# Patient Record
Sex: Female | Born: 1965 | Race: Black or African American | Hispanic: No | Marital: Single | Smoking: Never smoker
Health system: Southern US, Community
[De-identification: ages and names within clinical notes are randomized; demographics above are authoritative.]

## PROBLEM LIST (undated history)

## (undated) ENCOUNTER — Ambulatory Visit: Admission: EM | Source: Home / Self Care

## (undated) DIAGNOSIS — I10 Essential (primary) hypertension: Secondary | ICD-10-CM

## (undated) DIAGNOSIS — K219 Gastro-esophageal reflux disease without esophagitis: Secondary | ICD-10-CM

## (undated) HISTORY — DX: Essential (primary) hypertension: I10

## (undated) HISTORY — PX: OTHER SURGICAL HISTORY: SHX169

## (undated) HISTORY — PX: UMBILICAL HERNIA REPAIR: SUR1181

## (undated) HISTORY — PX: FRACTURE SURGERY: SHX138

## (undated) HISTORY — PX: KNEE SURGERY: SHX244

---

## 2004-06-26 HISTORY — PX: CHOLECYSTECTOMY: SHX55

## 2007-08-05 ENCOUNTER — Other Ambulatory Visit: Admission: RE | Admit: 2007-08-05 | Discharge: 2007-08-05 | Payer: Self-pay | Admitting: Family Medicine

## 2008-08-10 ENCOUNTER — Other Ambulatory Visit: Admission: RE | Admit: 2008-08-10 | Discharge: 2008-08-10 | Payer: Self-pay | Admitting: Family Medicine

## 2011-07-26 ENCOUNTER — Other Ambulatory Visit (HOSPITAL_COMMUNITY): Payer: Self-pay | Admitting: Physician Assistant

## 2011-07-26 DIAGNOSIS — Z1231 Encounter for screening mammogram for malignant neoplasm of breast: Secondary | ICD-10-CM

## 2011-08-22 ENCOUNTER — Ambulatory Visit (HOSPITAL_COMMUNITY)
Admission: RE | Admit: 2011-08-22 | Discharge: 2011-08-22 | Disposition: A | Discharge status: Home / Self Care | Payer: Self-pay | Source: Ambulatory Visit | Attending: Physician Assistant | Admitting: Physician Assistant

## 2011-08-22 DIAGNOSIS — Z1231 Encounter for screening mammogram for malignant neoplasm of breast: Secondary | ICD-10-CM

## 2012-08-06 ENCOUNTER — Other Ambulatory Visit (HOSPITAL_COMMUNITY): Payer: Self-pay | Admitting: Physician Assistant

## 2012-08-06 DIAGNOSIS — Z1231 Encounter for screening mammogram for malignant neoplasm of breast: Secondary | ICD-10-CM

## 2012-08-23 ENCOUNTER — Ambulatory Visit (HOSPITAL_COMMUNITY): Payer: Self-pay

## 2012-08-23 ENCOUNTER — Ambulatory Visit (HOSPITAL_COMMUNITY)
Admission: RE | Admit: 2012-08-23 | Discharge: 2012-08-23 | Disposition: A | Discharge status: Home / Self Care | Payer: Self-pay | Source: Ambulatory Visit | Attending: Physician Assistant | Admitting: Physician Assistant

## 2012-08-23 DIAGNOSIS — Z1231 Encounter for screening mammogram for malignant neoplasm of breast: Secondary | ICD-10-CM

## 2013-08-11 ENCOUNTER — Other Ambulatory Visit: Payer: Self-pay | Admitting: Nurse Practitioner

## 2013-08-11 DIAGNOSIS — N6459 Other signs and symptoms in breast: Secondary | ICD-10-CM

## 2013-08-11 DIAGNOSIS — N6310 Unspecified lump in the right breast, unspecified quadrant: Secondary | ICD-10-CM

## 2013-08-11 DIAGNOSIS — N6320 Unspecified lump in the left breast, unspecified quadrant: Secondary | ICD-10-CM

## 2013-08-13 ENCOUNTER — Other Ambulatory Visit: Payer: Self-pay

## 2013-08-22 ENCOUNTER — Ambulatory Visit
Admission: RE | Admit: 2013-08-22 | Discharge: 2013-08-22 | Disposition: A | Discharge status: Home / Self Care | Payer: BC Managed Care – PPO | Source: Ambulatory Visit | Attending: Nurse Practitioner | Admitting: Nurse Practitioner

## 2013-08-22 DIAGNOSIS — N6459 Other signs and symptoms in breast: Secondary | ICD-10-CM

## 2013-08-22 DIAGNOSIS — N6310 Unspecified lump in the right breast, unspecified quadrant: Secondary | ICD-10-CM

## 2013-08-22 DIAGNOSIS — N6320 Unspecified lump in the left breast, unspecified quadrant: Secondary | ICD-10-CM

## 2014-08-06 ENCOUNTER — Other Ambulatory Visit (HOSPITAL_COMMUNITY): Payer: Self-pay | Admitting: Internal Medicine

## 2014-08-06 DIAGNOSIS — Z1231 Encounter for screening mammogram for malignant neoplasm of breast: Secondary | ICD-10-CM

## 2014-09-01 ENCOUNTER — Ambulatory Visit (HOSPITAL_COMMUNITY)
Admission: RE | Admit: 2014-09-01 | Discharge: 2014-09-01 | Disposition: A | Discharge status: Home / Self Care | Payer: 59 | Source: Ambulatory Visit | Attending: Internal Medicine | Admitting: Internal Medicine

## 2014-09-01 ENCOUNTER — Other Ambulatory Visit (HOSPITAL_COMMUNITY): Payer: Self-pay | Admitting: Internal Medicine

## 2014-09-01 DIAGNOSIS — Z1231 Encounter for screening mammogram for malignant neoplasm of breast: Secondary | ICD-10-CM | POA: Insufficient documentation

## 2015-08-03 ENCOUNTER — Other Ambulatory Visit: Payer: Self-pay

## 2015-08-03 DIAGNOSIS — Z1231 Encounter for screening mammogram for malignant neoplasm of breast: Secondary | ICD-10-CM

## 2015-09-02 ENCOUNTER — Ambulatory Visit
Admission: RE | Admit: 2015-09-02 | Discharge: 2015-09-02 | Disposition: A | Discharge status: Home / Self Care | Payer: BLUE CROSS/BLUE SHIELD | Source: Ambulatory Visit

## 2015-09-02 DIAGNOSIS — Z1231 Encounter for screening mammogram for malignant neoplasm of breast: Secondary | ICD-10-CM

## 2016-03-02 ENCOUNTER — Encounter (HOSPITAL_COMMUNITY): Payer: Self-pay | Admitting: *Deleted

## 2016-03-02 ENCOUNTER — Emergency Department (HOSPITAL_COMMUNITY): Payer: Self-pay

## 2016-03-02 ENCOUNTER — Emergency Department (HOSPITAL_COMMUNITY)
Admission: EM | Admit: 2016-03-02 | Discharge: 2016-03-02 | Disposition: A | Discharge status: Home / Self Care | Payer: Self-pay | Attending: Emergency Medicine | Admitting: Emergency Medicine

## 2016-03-02 DIAGNOSIS — R1032 Left lower quadrant pain: Secondary | ICD-10-CM | POA: Insufficient documentation

## 2016-03-02 DIAGNOSIS — N760 Acute vaginitis: Secondary | ICD-10-CM | POA: Insufficient documentation

## 2016-03-02 DIAGNOSIS — B9689 Other specified bacterial agents as the cause of diseases classified elsewhere: Secondary | ICD-10-CM

## 2016-03-02 LAB — URINALYSIS, ROUTINE W REFLEX MICROSCOPIC
Bilirubin Urine: NEGATIVE
GLUCOSE, UA: NEGATIVE mg/dL
Ketones, ur: 15 mg/dL — AB
LEUKOCYTES UA: NEGATIVE
Nitrite: NEGATIVE
PH: 6.5 (ref 5.0–8.0)
Protein, ur: NEGATIVE mg/dL
Specific Gravity, Urine: >1.046 — ABNORMAL HIGH (ref 1.005–1.030)

## 2016-03-02 LAB — COMPREHENSIVE METABOLIC PANEL
ALT: 18 U/L (ref 14–54)
ANION GAP: 9 (ref 5–15)
AST: 21 U/L (ref 15–41)
Albumin: 3.9 g/dL (ref 3.5–5.0)
Alkaline Phosphatase: 50 U/L (ref 38–126)
BILIRUBIN TOTAL: 1.1 mg/dL (ref 0.3–1.2)
BUN: 12 mg/dL (ref 6–20)
CHLORIDE: 106 mmol/L (ref 101–111)
CO2: 22 mmol/L (ref 22–32)
Calcium: 9.3 mg/dL (ref 8.9–10.3)
Creatinine, Ser: 0.69 mg/dL (ref 0.44–1.00)
Glucose, Bld: 85 mg/dL (ref 65–99)
Potassium: 4.2 mmol/L (ref 3.5–5.1)
Sodium: 137 mmol/L (ref 135–145)
TOTAL PROTEIN: 8.3 g/dL — AB (ref 6.5–8.1)

## 2016-03-02 LAB — WET PREP, GENITAL
TRICH WET PREP: NONE SEEN
Yeast Wet Prep HPF POC: NONE SEEN

## 2016-03-02 LAB — CBC
HEMATOCRIT: 38.4 % (ref 36.0–46.0)
HEMOGLOBIN: 12.9 g/dL (ref 12.0–15.0)
MCH: 34.1 pg — ABNORMAL HIGH (ref 26.0–34.0)
MCHC: 33.6 g/dL (ref 30.0–36.0)
MCV: 101.6 fL — AB (ref 78.0–100.0)
Platelets: 369 10*3/uL (ref 150–400)
RBC: 3.78 MIL/uL — AB (ref 3.87–5.11)
RDW: 12 % (ref 11.5–15.5)
WBC: 6.3 10*3/uL (ref 4.0–10.5)

## 2016-03-02 LAB — URINE MICROSCOPIC-ADD ON

## 2016-03-02 LAB — PREGNANCY, URINE: PREG TEST UR: NEGATIVE

## 2016-03-02 LAB — LIPASE, BLOOD: LIPASE: 37 U/L (ref 11–51)

## 2016-03-02 MED ORDER — METRONIDAZOLE 500 MG PO TABS
500.0000 mg | ORAL_TABLET | Freq: Two times a day (BID) | ORAL | 0 refills | Status: DC
Start: 2016-03-02 — End: 2016-06-04

## 2016-03-02 MED ORDER — IOPAMIDOL (ISOVUE-300) INJECTION 61%
100.0000 mL | Freq: Once | INTRAVENOUS | Status: AC | PRN
  Administered 2016-03-02: 100 mL via INTRAVENOUS

## 2016-03-02 NOTE — ED Provider Notes (Signed)
[facility] DEPT Provider Note   CSN: [id number] Arrival date & time: 03/02/16  1023     History   Chief Complaint Chief Complaint  Patient presents with  . Abdominal Pain    HPI Emma Harris is a 50 y.o. female.  Emma Harris is a 50 y.o. female with no pertinent medical history presents to ED with complaint of intermittent LLQ abdominal pain. Pain started approximately 1.5 weeks ago with radiation into left flank. No discernable pattern of pain onset. When she experiences pain it is 4/10 and described as a sharp sensation. She reports she has been more constipated; last bowel movement yesterday and described as firm. No blood in stool. No fever, nausea, vomiting, dysuria, hematuria, pelvic pain, vaginal pain, or vaginal discharge. She does report vaginal spotting x 2 last week. LMP 02/17/16. She is sexually active with one female partner, significant other, no protection used. No treatments tried.       History reviewed. No pertinent past medical history.  There are no active problems to display for this patient.   Past Surgical History:  Procedure Laterality Date  . CHOLECYSTECTOMY  2006  . FRACTURE SURGERY Left    Knee    OB History    No data available       Home Medications    Prior to Admission medications   Medication Sig Start Date End Date Taking? Authorizing Provider  TRINESSA LO 0.18/0.215/0.25 MG-25 MCG tab Take 1 tablet by mouth daily. 01/01/16  Yes Historical Provider, MD  metroNIDAZOLE (FLAGYL) 500 MG tablet Take 1 tablet (500 mg total) by mouth 2 (two) times daily. 03/02/16   Roxanna Mew, PA-C    Family History No family history on file.  Social History Social History  Substance Use Topics  . Smoking status: Never Smoker  . Smokeless tobacco: Never Used  . Alcohol use No     Comment: rare     Allergies   Codeine   Review of Systems Review of Systems  Constitutional: Negative for chills, diaphoresis and fever.  HENT:  Negative for trouble swallowing.   Eyes: Negative for visual disturbance.  Respiratory: Negative for shortness of breath.   Cardiovascular: Negative for chest pain.  Gastrointestinal: Positive for abdominal pain and constipation. Negative for blood in stool, diarrhea, nausea and vomiting.  Genitourinary: Positive for vaginal bleeding ( intermittent, since resolved). Negative for dysuria, hematuria, pelvic pain, vaginal discharge and vaginal pain.  Musculoskeletal: Negative for neck pain.  Skin: Negative for rash.  Neurological: Negative for weakness, numbness and headaches.     Physical Exam Updated Vital Signs BP 145/77 (BP Location: Left Arm)   Pulse 78   Temp 98.1 F (36.7 C) (Oral)   Resp 18   Ht 5\' 4"  (1.626 m)   Wt 69.5 kg   LMP 02/17/2016   SpO2 98%   BMI 26.30 kg/m   Physical Exam  Constitutional: She appears well-developed and well-nourished. No distress.  HENT:  Head: Normocephalic and atraumatic.  Mouth/Throat: Oropharynx is clear and moist. No oropharyngeal exudate.  Eyes: Conjunctivae and EOM are normal. Pupils are equal, round, and reactive to light. Right eye exhibits no discharge. Left eye exhibits no discharge. No scleral icterus.  Neck: Normal range of motion and phonation normal. Neck supple. No neck rigidity. Normal range of motion present.  Cardiovascular: Normal rate, regular rhythm, normal heart sounds and intact distal pulses.   No murmur heard. Pulmonary/Chest: Effort normal and breath sounds normal. No stridor. No respiratory distress. She  has no wheezes. She has no rales.  Abdominal: Soft. Bowel sounds are normal. She exhibits no distension. There is tenderness in the left lower quadrant. There is no rigidity, no rebound, no guarding and no CVA tenderness.  TTP in LLQ. No peritoneal signs.   Musculoskeletal: Normal range of motion.  Lymphadenopathy:    She has no cervical adenopathy.  Neurological: She is alert. She is not disoriented. Coordination  and gait normal. GCS eye subscore is 4. GCS verbal subscore is 5. GCS motor subscore is 6.  Skin: Skin is warm and dry. She is not diaphoretic.  Psychiatric: She has a normal mood and affect. Her behavior is normal.     ED Treatments / Results  Labs (all labs ordered are listed, but only abnormal results are displayed) Labs Reviewed  WET PREP, GENITAL - Abnormal; Notable for the following:       Result Value   Clue Cells Wet Prep HPF POC PRESENT (*)    WBC, Wet Prep HPF POC FEW (*)    All other components within normal limits  COMPREHENSIVE METABOLIC PANEL - Abnormal; Notable for the following:    Total Protein 8.3 (*)    All other components within normal limits  CBC - Abnormal; Notable for the following:    RBC 3.78 (*)    MCV 101.6 (*)    MCH 34.1 (*)    All other components within normal limits  URINALYSIS, ROUTINE W REFLEX MICROSCOPIC (NOT AT [facility]) - Abnormal; Notable for the following:    Specific Gravity, Urine >1.046 (*)    Hgb urine dipstick TRACE (*)    Ketones, ur 15 (*)    All other components within normal limits  URINE MICROSCOPIC-ADD ON - Abnormal; Notable for the following:    Squamous Epithelial / LPF 0-5 (*)    Bacteria, UA RARE (*)    All other components within normal limits  LIPASE, BLOOD  PREGNANCY, URINE  GC/CHLAMYDIA PROBE AMP () NOT AT [facility]    EKG  EKG Interpretation None       Radiology Ct Abdomen Pelvis W Contrast  Result Date: 03/02/2016 CLINICAL DATA:  Ten day history of left lower quadrant pain. More recent right lower quadrant pain. EXAM: CT ABDOMEN AND PELVIS WITH CONTRAST TECHNIQUE: Multidetector CT imaging of the abdomen and pelvis was performed using the standard protocol following bolus administration of intravenous contrast. CONTRAST:  133mL ISOVUE-300 IOPAMIDOL (ISOVUE-300) INJECTION 61% COMPARISON:  None. FINDINGS: Lower chest:  Lung bases are clear.  There is a small hiatal hernia. Hepatobiliary: No focal liver lesions  are apparent. Gallbladder is absent. A small amount of air is noted in the extrahepatic biliary ductal system. There is no biliary duct dilatation. Pancreas: There is no pancreatic mass or inflammatory focus. Spleen: No splenic lesions are evident. Adrenals/Urinary Tract: Adrenals appear normal. Kidneys bilaterally show no evidence of mass or hydronephrosis on either side. There is no renal or ureteral calculus on either side. Urinary bladder is midline with wall thickness within normal limits. Stomach/Bowel: There is no appreciable bowel wall or mesenteric thickening. There is moderate stool throughout the colon. There is no bowel obstruction. No free air or portal venous air. Vascular/Lymphatic: There is no abdominal aortic aneurysm. The major mesenteric vessels appear normal. No focal vascular lesion is evident on this study. There is no adenopathy in the abdomen or pelvis. Reproductive: Uterus is anteverted. The uterus is diffusely enlarged with multiple mass lesions which appear to arise from the uterus,  felt to represent multiple eccentric leiomyomas. Ovaries cannot be distinguished reliably from the enlarged, apparently leiomyomatous uterus. It is difficult to exclude an ovarian lesion given the size and eccentric appearance of these appearing uterine lesions. There is no free pelvic fluid. Musculoskeletal: There is marked thoracolumbar dextroscoliosis. There are no blastic or lytic bone lesions. There is no intramuscular or abdominal wall lesion. Other: Appendix is not appreciable. There is no periappendiceal region inflammation. There is no abscess or ascites in the abdomen or pelvis. IMPRESSION: Enlarged apparently leiomyomatous uterus. Ovaries cannot be distinguished from the uterus. If there is concern for potential ovarian pathology, correlation with pelvic ultrasound potentially could be helpful to further assess. There is no bowel wall thickening or mesenteric thickening. No evidence of diverticulitis  in particular. No bowel obstruction. No abscess. No periappendiceal region inflammation. Gallbladder absent. A small amount of air in the extrahepatic biliary duct system suggests probable prior sphincterotomy. There is a small hiatal hernia. No renal or ureteral calculi.  No hydronephrosis. Scoliosis. Electronically Signed   By: Lowella Grip III M.D.   On: 03/02/2016 14:05    Procedures Procedures (including critical care time)  Medications Ordered in ED Medications  iopamidol (ISOVUE-300) 61 % injection 100 mL (100 mLs Intravenous Contrast Given 03/02/16 1346)     Initial Impression / Assessment and Plan / ED Course  I have reviewed the triage vital signs and the nursing notes.  Pertinent labs & imaging results that were available during my care of the patient were reviewed by me and considered in my medical decision making (see chart for details).  Clinical Course  Value Comment By Time  Appearance: CLEAR (Reviewed) Roxanna Mew, PA-C 09/07 1723  CT Abdomen Pelvis W Contrast Reviewed Roxanna Mew, PA-C 09/07 1723    Patient presents to ED complaint of intermittent abdominal pain. Patient is afebrile and non-toxic appearing in NAD. Vital signs remarkable for elevated BP, otherwise stable. Physical exam remarkable for TTP of LLQ with mild guarding. No peritoneal signs. Concern for possible ?diverticulitis vs. ?UTI/pyelonephritis. Will check labs, pelvic exam for possible pelvic infection, and CT abd/pelv given age and physical exam findings. Patient declines anything for pain at this time.   CBC re-assuring. Lipase normal - low suspicion for pancreatitis. Patient is s/p cholecystectomy. LFTs nml. Urine pregnancy negative - doubt ectopic pregnancy. U/A negative for UTI. Wet prep positive for bacterial vaginosis. CT abdomen/pelvis show no acute abdominal pathology. Moderate stool noted in colon. Of incidental finding is leiomyomatous uterus, recommend [place] follow up.    Discussed results and plan with patient. Symptomatic management of constipation. Rx for bacterial vaginosis. Encouraged follow up with OBGYN regarding pelvic US for further evaluation of leiomyomatous uterus. Return precautions discussed. Patient voiced understanding and is agreeable.    Final Clinical Impressions(s) / ED Diagnoses   Final diagnoses:  Left lower quadrant pain  Bacterial vaginosis    New Prescriptions New Prescriptions   METRONIDAZOLE (FLAGYL) 500 MG TABLET    Take 1 tablet (500 mg total) by mouth 2 (two) times daily.     Roxanna Mew, PA-C 03/02/16 [place], MD 03/02/16 [phone]

## 2016-03-02 NOTE — ED Triage Notes (Addendum)
Pt reports she started LLQ pain x 1.5 week ago and recently started to have pain in her RLQ as well.  Denies any n/v/d.  Pt reports pain is intermittent.  Pt reports noticing scant blood on her panty liner and when wiping after urinating.

## 2016-03-02 NOTE — Discharge Instructions (Signed)
Read the information below.   Your labs are re-assuring.  You have bacterial vaginosis and are being prescribed antibiotics. Take as directed.  Your CT scan shows stool in colon to suggest some constipation. Also of note, you have fibroids within your uterus. It is recommended that you have further evaluation with an [place] of your pelvis. Please call your OBGYN ASAP to schedule a follow up appointment.  Try taking OTC miralax for relief of constipation. Eat high fiber foods. Drink plenty of water. Get exercise.  You may return to the Emergency Department at any time for worsening condition or any new symptoms that concern you. Return to ED if you develop constant localized abdominal pain, fever, blood in stool.

## 2016-03-03 LAB — GC/CHLAMYDIA PROBE AMP (~~LOC~~) NOT AT ARMC
Chlamydia: NEGATIVE
Neisseria Gonorrhea: NEGATIVE

## 2016-04-03 ENCOUNTER — Emergency Department (HOSPITAL_COMMUNITY)
Admission: EM | Admit: 2016-04-03 | Discharge: 2016-04-03 | Disposition: A | Discharge status: Home / Self Care | Payer: Self-pay | Attending: Emergency Medicine | Admitting: Emergency Medicine

## 2016-04-03 ENCOUNTER — Emergency Department (HOSPITAL_COMMUNITY): Payer: Self-pay

## 2016-04-03 ENCOUNTER — Encounter (HOSPITAL_COMMUNITY): Payer: Self-pay

## 2016-04-03 DIAGNOSIS — R079 Chest pain, unspecified: Secondary | ICD-10-CM

## 2016-04-03 DIAGNOSIS — R0789 Other chest pain: Secondary | ICD-10-CM | POA: Insufficient documentation

## 2016-04-03 MED ORDER — METHOCARBAMOL 500 MG PO TABS: 500.0000 mg | ORAL_TABLET | Freq: Four times a day (QID) | ORAL | 0 refills | Status: DC

## 2016-04-03 MED ORDER — IBUPROFEN 400 MG PO TABS: 400.0000 mg | ORAL_TABLET | Freq: Four times a day (QID) | ORAL | 0 refills | Status: DC | PRN

## 2016-04-03 MED ORDER — IBUPROFEN 800 MG PO TABS
800.0000 mg | ORAL_TABLET | Freq: Once | ORAL | Status: AC
  Administered 2016-04-03: 800 mg via ORAL
  Filled 2016-04-03: qty 1

## 2016-04-03 NOTE — ED Provider Notes (Signed)
[facility] DEPT Provider Note   CSN: [id number] Arrival date & time: 04/03/16  [id number]     History   Chief Complaint Chief Complaint  Patient presents with  . Flank Pain    HPI Emma Harris is a 50 y.o. female.  50 year old female presents with one-week history of intermittent left costal margin sharp pain that is worse with position. Denies any associated dyspnea, cough, fever, chills. No anginal symptoms. Denies any rashes to the skin. No prior history of same. No medications taken for this. Denies any injury. When patient was questioned as to why she came in this morning, she states she does want to find out what was wrong with her. States her symptoms have not become worse. Called EMS and was transported here.      History reviewed. No pertinent past medical history.  There are no active problems to display for this patient.   Past Surgical History:  Procedure Laterality Date  . CHOLECYSTECTOMY  2006  . FRACTURE SURGERY Left    Knee    OB History    No data available       Home Medications    Prior to Admission medications   Medication Sig Start Date End Date Taking? Authorizing Provider  metroNIDAZOLE (FLAGYL) 500 MG tablet Take 1 tablet (500 mg total) by mouth 2 (two) times daily. 03/02/16   Roxanna Mew, PA-C  TRINESSA LO 0.18/0.215/0.25 MG-25 MCG tab Take 1 tablet by mouth daily. 01/01/16   Historical Provider, MD    Family History History reviewed. No pertinent family history.  Social History Social History  Substance Use Topics  . Smoking status: Never Smoker  . Smokeless tobacco: Never Used  . Alcohol use No     Comment: rare     Allergies   Codeine   Review of Systems Review of Systems  All other systems reviewed and are negative.    Physical Exam Updated Vital Signs BP 171/98   Pulse 92   Temp 98.4 F (36.9 C)   Resp 16   Ht 5\' 6"  (1.676 m)   Wt 69.4 kg   SpO2 98%   BMI 24.69 kg/m   Physical Exam    Constitutional: She is oriented to person, place, and time. She appears well-developed and well-nourished.  Non-toxic appearance. No distress.  HENT:  Head: Normocephalic and atraumatic.  Eyes: Conjunctivae, EOM and lids are normal. Pupils are equal, round, and reactive to light.  Neck: Normal range of motion. Neck supple. No tracheal deviation present. No thyroid mass present.  Cardiovascular: Normal rate, regular rhythm and normal heart sounds.  Exam reveals no gallop.   No murmur heard. Pulmonary/Chest: Effort normal and breath sounds normal. No stridor. No respiratory distress. She has no decreased breath sounds. She has no wheezes. She has no rhonchi. She has no rales.    Abdominal: Soft. Normal appearance and bowel sounds are normal. She exhibits no distension. There is no tenderness. There is no rebound and no CVA tenderness.  Musculoskeletal: Normal range of motion. She exhibits no edema or tenderness.  Neurological: She is alert and oriented to person, place, and time. She has normal strength. No cranial nerve deficit or sensory deficit. GCS eye subscore is 4. GCS verbal subscore is 5. GCS motor subscore is 6.  Skin: Skin is warm and dry. No abrasion and no rash noted.  Psychiatric: She has a normal mood and affect. Her speech is normal and behavior is normal.  Nursing note and vitals reviewed.  ED Treatments / Results  Labs (all labs ordered are listed, but only abnormal results are displayed) Labs Reviewed - No data to display  EKG  EKG Interpretation None       Radiology No results found.  Procedures Procedures (including critical care time)  Medications Ordered in ED Medications  ibuprofen (ADVIL,MOTRIN) tablet 800 mg (not administered)     Initial Impression / Assessment and Plan / ED Course  I have reviewed the triage vital signs and the nursing notes.  Pertinent labs & imaging results that were available during my care of the patient were reviewed by  me and considered in my medical decision making (see chart for details).  Clinical Course    Motrin given patient feels better. Chest x-ray is negative. Suspect chest wall pain will prescribe muscle relaxants.  Final Clinical Impressions(s) / ED Diagnoses   Final diagnoses:  Chest pain    New Prescriptions New Prescriptions   No medications on file     Lacretia Leigh, MD 04/03/16 [phone]

## 2016-04-03 NOTE — ED Notes (Signed)
Bed: [id number] Expected date:  Expected time:  Means of arrival:  Comments: 50yo F left flank pain

## 2016-04-03 NOTE — ED Triage Notes (Signed)
Left flank pain for one week off and on no N/V/D no fever no problems urinating.

## 2016-06-04 ENCOUNTER — Emergency Department (HOSPITAL_COMMUNITY)
Admission: EM | Admit: 2016-06-04 | Discharge: 2016-06-04 | Disposition: A | Discharge status: Home / Self Care | Payer: BLUE CROSS/BLUE SHIELD | Attending: Emergency Medicine | Admitting: Emergency Medicine

## 2016-06-04 DIAGNOSIS — M62830 Muscle spasm of back: Secondary | ICD-10-CM | POA: Insufficient documentation

## 2016-06-04 DIAGNOSIS — Z79899 Other long term (current) drug therapy: Secondary | ICD-10-CM | POA: Insufficient documentation

## 2016-06-04 LAB — POC URINE PREG, ED: PREG TEST UR: NEGATIVE

## 2016-06-04 MED ORDER — NAPROXEN 500 MG PO TABS
500.0000 mg | ORAL_TABLET | Freq: Once | ORAL | Status: AC
  Administered 2016-06-04: 500 mg via ORAL
  Filled 2016-06-04: qty 1

## 2016-06-04 MED ORDER — CYCLOBENZAPRINE HCL 5 MG PO TABS: 5.0000 mg | ORAL_TABLET | Freq: Three times a day (TID) | ORAL | 0 refills | Status: DC | PRN

## 2016-06-04 MED ORDER — NAPROXEN 500 MG PO TABS: 500.0000 mg | ORAL_TABLET | Freq: Two times a day (BID) | ORAL | 0 refills | Status: DC

## 2016-06-04 MED ORDER — OXYCODONE-ACETAMINOPHEN 5-325 MG PO TABS
1.0000 | ORAL_TABLET | Freq: Once | ORAL | Status: AC
  Administered 2016-06-04: 1 via ORAL
  Filled 2016-06-04: qty 1

## 2016-06-04 NOTE — Discharge Instructions (Signed)
Return to the ED with any concerns including difficulty breathing, weakness of arms or legs, fever/chills, or any other alarming symptoms

## 2016-06-04 NOTE — ED Triage Notes (Addendum)
Pt reports upper back pain that began at 0400 this am. Pain with movement including standing up and down. No known injury or exertional activity. Had this pain before, but went away spontaneously. No CP or SOB.

## 2016-06-04 NOTE — ED Provider Notes (Signed)
[facility] DEPT Provider Note   CSN: [id number] Arrival date & time: 06/04/16  1837     History   Chief Complaint Chief Complaint  Patient presents with  . Back Pain    HPI Emma Harris is a 50 y.o. female.  HPI  Pt presenting with c/o upper back pain. Pain is in right upper back. She states she has had similar pain in the past and it has been intermittent over the past year.  She took robaxin today which did not help very much.  Pain began last night and became worse gradually throughout the day today.  No injury.  No dififculty breathing.  No fever/chills, no cough. No new activities.  There are no other associated systemic symptoms, there are no other alleviating or modifying factors.   No neck pain or weakness of arms or legs.    No past medical history on file.  There are no active problems to display for this patient.   Past Surgical History:  Procedure Laterality Date  . CHOLECYSTECTOMY  2006  . FRACTURE SURGERY Left    Knee    OB History    No data available       Home Medications    Prior to Admission medications   Medication Sig Start Date End Date Taking? Authorizing Provider  ibuprofen (ADVIL,MOTRIN) 800 MG tablet Take 800 mg by mouth every 8 (eight) hours as needed for headache, mild pain or moderate pain.   Yes Historical Provider, MD  methocarbamol (ROBAXIN) 500 MG tablet Take 500 mg by mouth 4 (four) times daily as needed for muscle spasms.   Yes Historical Provider, MD  cyclobenzaprine (FLEXERIL) 5 MG tablet Take 1 tablet (5 mg total) by mouth 3 (three) times daily as needed for muscle spasms. 06/04/16   Alfonzo Beers, MD  naproxen (NAPROSYN) 500 MG tablet Take 1 tablet (500 mg total) by mouth 2 (two) times daily. 06/04/16   Alfonzo Beers, MD    Family History No family history on file.  Social History Social History  Substance Use Topics  . Smoking status: Never Smoker  . Smokeless tobacco: Never Used  . Alcohol use No     Comment:  rare     Allergies   Shellfish allergy and Codeine   Review of Systems Review of Systems  ROS reviewed and all otherwise negative except for mentioned in HPI   Physical Exam Updated Vital Signs BP 143/99 (BP Location: Right Arm)   Pulse 86   Temp 98.2 F (36.8 C) (Oral)   Resp 16   SpO2 97%  Vitals reviewed Physical Exam Physical Examination: General appearance - alert, well appearing, and in no distress Mental status - alert, oriented to person, place, and time Eyes - no conjunctival injection, no scleral icterus Neck - no midline or paraspinal tenderness to palpation Chest - clear to auscultation, no wheezes, rales or rhonchi, symmetric air entry Heart - normal rate, regular rhythm, normal S1, S2, no murmurs, rubs, clicks or gallops Abdomen - soft, nontender, nondistended, no masses or organomegaly Back exam -  No midline tenderness to palpation of c/t/l spine, ttp over right rhomboid distribution Neurological - alert, oriented, normal speech, strength 5/5 in extremities x 4, sensation intact Musculoskeletal - no joint tenderness, deformity or swelling Extremities - peripheral pulses normal, no pedal edema, no clubbing or cyanosis Skin - normal coloration and turgor, no rashes  ED Treatments / Results  Labs (all labs ordered are listed, but only abnormal results are displayed)  Labs Reviewed  POC URINE PREG, ED    EKG  EKG Interpretation None       Radiology No results found.  Procedures Procedures (including critical care time)  Medications Ordered in ED Medications  oxyCODONE-acetaminophen (PERCOCET/ROXICET) 5-325 MG per tablet 1 tablet (1 tablet Oral Given 06/04/16 2029)  naproxen (NAPROSYN) tablet 500 mg (500 mg Oral Given 06/04/16 2030)     Initial Impression / Assessment and Plan / ED Course  I have reviewed the triage vital signs and the nursing notes.  Pertinent labs & imaging results that were available during my care of the patient were  reviewed by me and considered in my medical decision making (see chart for details).  Clinical Course     Pt presenting with c/o right upper back pain- pain is located over right rhomboid distribution. No bony point tenderness.  Pt treated with pain meds and muscle relaxer.  Discharged with strict return precautions.  Pt agreeable with plan.  Final Clinical Impressions(s) / ED Diagnoses   Final diagnoses:  Muscle spasm of back    New Prescriptions Discharge Medication List as of 06/04/2016  9:19 PM    START taking these medications   Details  cyclobenzaprine (FLEXERIL) 5 MG tablet Take 1 tablet (5 mg total) by mouth 3 (three) times daily as needed for muscle spasms., Starting Sun 06/04/2016, Print    naproxen (NAPROSYN) 500 MG tablet Take 1 tablet (500 mg total) by mouth 2 (two) times daily., Starting Sun 06/04/2016, Print         Alfonzo Beers, MD 06/07/16 [phone]

## 2016-09-06 ENCOUNTER — Emergency Department (HOSPITAL_COMMUNITY)
Admission: EM | Admit: 2016-09-06 | Discharge: 2016-09-06 | Disposition: A | Discharge status: Home / Self Care | Payer: BLUE CROSS/BLUE SHIELD | Attending: Emergency Medicine | Admitting: Emergency Medicine

## 2016-09-06 ENCOUNTER — Emergency Department (HOSPITAL_COMMUNITY): Payer: BLUE CROSS/BLUE SHIELD

## 2016-09-06 ENCOUNTER — Encounter (HOSPITAL_COMMUNITY): Payer: Self-pay

## 2016-09-06 DIAGNOSIS — X500XXA Overexertion from strenuous movement or load, initial encounter: Secondary | ICD-10-CM | POA: Insufficient documentation

## 2016-09-06 DIAGNOSIS — R0789 Other chest pain: Secondary | ICD-10-CM | POA: Insufficient documentation

## 2016-09-06 DIAGNOSIS — Z79899 Other long term (current) drug therapy: Secondary | ICD-10-CM | POA: Insufficient documentation

## 2016-09-06 DIAGNOSIS — Y929 Unspecified place or not applicable: Secondary | ICD-10-CM | POA: Insufficient documentation

## 2016-09-06 DIAGNOSIS — Y990 Civilian activity done for income or pay: Secondary | ICD-10-CM | POA: Insufficient documentation

## 2016-09-06 DIAGNOSIS — Y939 Activity, unspecified: Secondary | ICD-10-CM | POA: Insufficient documentation

## 2016-09-06 MED ORDER — CYCLOBENZAPRINE HCL 10 MG PO TABS: 10.0000 mg | ORAL_TABLET | Freq: Three times a day (TID) | ORAL | 0 refills | Status: DC | PRN

## 2016-09-06 MED ORDER — MELOXICAM 7.5 MG PO TABS: 7.5000 mg | ORAL_TABLET | Freq: Every day | ORAL | 0 refills | Status: DC | PRN

## 2016-09-06 NOTE — ED Provider Notes (Signed)
[facility] DEPT Provider Note   CSN: [id number] Arrival date & time: 09/06/16  1014     History   Chief Complaint Chief Complaint  Patient presents with  . Chest Pain  . Shoulder Pain    HPI Emma Harris is a 51 y.o. female.   HPI   Patient presents with right sided chest pain that has been ongoing for 1 week, worse with movement of her arm and lifting things.  She does heavy lifting at her job and this has exacerbated her pain.  The pain is also worse with certain positions, particularly getting comfortable at night.  The pain is not exertional or pleuritic.  She took 500mg  robaxin, and ibuprofen without improvement.  Denies fevers, cough, SOB, diaphoresis, lightheadedness/dizziness, nausea, leg swelling.  No recent immobilization, exogenous estrogen, family or personal hx blood clots, family hx early CAD.   She states that this pain feels muscular but that she thinks she needs stronger medicine for it.    History reviewed. No pertinent past medical history.  There are no active problems to display for this patient.   Past Surgical History:  Procedure Laterality Date  . CHOLECYSTECTOMY  2006  . FRACTURE SURGERY Left    Knee    OB History    No data available       Home Medications    Prior to Admission medications   Medication Sig Start Date End Date Taking? Authorizing Provider  cyclobenzaprine (FLEXERIL) 10 MG tablet Take 1 tablet (10 mg total) by mouth 3 (three) times daily as needed for muscle spasms (or pain). 09/06/16   Clayton Bibles, PA-C  meloxicam (MOBIC) 7.5 MG tablet Take 1 tablet (7.5 mg total) by mouth daily as needed for pain. 09/06/16   Clayton Bibles, PA-C    Family History History reviewed. No pertinent family history.  Social History Social History  Substance Use Topics  . Smoking status: Never Smoker  . Smokeless tobacco: Never Used  . Alcohol use No     Comment: rare     Allergies   Shellfish allergy and Codeine   Review of  Systems Review of Systems  Constitutional: Negative for chills and fever.  Respiratory: Negative for cough and shortness of breath.   Cardiovascular: Positive for chest pain.  Gastrointestinal: Negative for nausea.  Musculoskeletal: Negative for back pain and neck pain.  Skin: Negative for color change.  Allergic/Immunologic: Negative for immunocompromised state.  Neurological: Negative for dizziness, weakness, light-headedness and numbness.     Physical Exam Updated Vital Signs BP 151/91 (BP Location: Left Arm)   Pulse 95   Temp 98.1 F (36.7 C) (Oral)   Resp 18   SpO2 100%   Physical Exam  Constitutional: She appears well-developed and well-nourished. No distress.  HENT:  Head: Normocephalic and atraumatic.  Neck: Neck supple.  Cardiovascular: Normal rate and regular rhythm.   Pulmonary/Chest: Effort normal and breath sounds normal. No respiratory distress. She has no wheezes. She has no rales. She exhibits tenderness (right).  Abdominal: Soft. She exhibits no distension. There is no tenderness. There is no rebound and no guarding.  Musculoskeletal: She exhibits no edema.  Neurological: She is alert.  Skin: She is not diaphoretic.  Nursing note and vitals reviewed.    ED Treatments / Results  Labs (all labs ordered are listed, but only abnormal results are displayed) Labs Reviewed - No data to display  EKG  EKG Interpretation  Date/Time:  Wednesday September 06 2016 10:23:36 EDT Ventricular Rate:  94 PR Interval:    QRS Duration: 83 QT Interval:  359 QTC Calculation: 449 R Axis:   49 Text Interpretation:  Sinus rhythm Low voltage, precordial leads Abnormal R-wave progression, early transition Nonspecific T abnormalities, lateral leads agree. no STEMI. no acute ischemic appearance. no old comparison Confirmed by Johnney Killian, MD, Jeannie Done [phone]) on 09/06/2016 10:31:02 AM Also confirmed by Johnney Killian, MD, Jeannie Done [phone]), editor Drema Pry [phone])  on 09/06/2016  10:36:02 AM       Radiology Dg Chest 2 View  Result Date: 09/06/2016 CLINICAL DATA:  New mid chest pain for approx 1 week, nonsmoker, no other chest complaints EXAM: CHEST  2 VIEW COMPARISON:  04/03/2016 FINDINGS: The cardiac silhouette is normal in size and configuration. No mediastinal or hilar masses or evidence of adenopathy. The lungs are clear.  No pleural effusion.  No pneumothorax. There is a prominent S-shaped scoliosis of the thoracic spine, convex to the left in the upper thoracic spine and to the right in the lower thoracic spine, stable. IMPRESSION: No active cardiopulmonary disease. Electronically Signed   By: Lajean Manes M.D.   On: 09/06/2016 10:56    Procedures Procedures (including critical care time)  Medications Ordered in ED Medications - No data to display   Initial Impression / Assessment and Plan / ED Course  I have reviewed the triage vital signs and the nursing notes.  Pertinent labs & imaging results that were available during my care of the patient were reviewed by me and considered in my medical decision making (see chart for details).    Afebrile, nontoxic patient with chest wall pain and tenderness reproduced with palpation.  Has been doing heavy lifting - symptoms reproduced with similar listing.  Not exertional, not pleuritic.  Clinically doubt ACS, PE.  CXR negative.  EKG nonischemic.    D/C home with flexeril, mobic. PCP follow up.   Discussed result, findings, treatment, and follow up  with patient.  Pt given return precautions.  Pt verbalizes understanding and agrees with plan.       Final Clinical Impressions(s) / ED Diagnoses   Final diagnoses:  Chest wall pain    New Prescriptions Discharge Medication List as of 09/06/2016 11:15 AM    START taking these medications   Details  meloxicam (MOBIC) 7.5 MG tablet Take 1 tablet (7.5 mg total) by mouth daily as needed for pain., Starting Wed 09/06/2016, Print         [place], PA-C 09/06/16  [place], MD 09/06/16 2011

## 2016-09-06 NOTE — ED Triage Notes (Signed)
Pt states chest and shoulder pain x 1 week.  Worse when lying down.  Shoulder hurts with movement.  No shortness of breath.  No cough/congestion.  Chest pain increases with movement.  Pt does lift heavy objects at work.  Pain comes and goes.

## 2016-09-06 NOTE — Discharge Instructions (Signed)
Read the information below.  Use the prescribed medication as directed.  Please discuss all new medications with your pharmacist.  You may return to the Emergency Department at any time for worsening condition or any new symptoms that concern you.    If you develop worsening chest pain, shortness of breath, fever, you pass out, or become weak or dizzy, return to the ER for a recheck.     You have been diagnosed by your caregiver as having chest wall pain. SEEK IMMEDIATE MEDICAL ATTENTION IF: You develop a fever.  Your chest pains become severe or intolerable.  You develop new, unexplained symptoms (problems).  You develop shortness of breath, nausea, vomiting, sweating or feel light headed.  You develop a new cough or you cough up blood.

## 2016-12-13 ENCOUNTER — Emergency Department (HOSPITAL_COMMUNITY)
Admission: EM | Admit: 2016-12-13 | Discharge: 2016-12-13 | Disposition: A | Discharge status: Home / Self Care | Payer: BLUE CROSS/BLUE SHIELD | Attending: Emergency Medicine | Admitting: Emergency Medicine

## 2016-12-13 DIAGNOSIS — M25511 Pain in right shoulder: Secondary | ICD-10-CM | POA: Insufficient documentation

## 2016-12-13 DIAGNOSIS — R1013 Epigastric pain: Secondary | ICD-10-CM | POA: Insufficient documentation

## 2016-12-13 DIAGNOSIS — M79604 Pain in right leg: Secondary | ICD-10-CM | POA: Insufficient documentation

## 2016-12-13 DIAGNOSIS — R6889 Other general symptoms and signs: Secondary | ICD-10-CM | POA: Insufficient documentation

## 2016-12-13 NOTE — ED Triage Notes (Addendum)
Pt c/o right arm, right leg pain, with some intermittent abdominal pain- pain when moving right foot. Pt states symptoms have been occurring for a week on and off. Right arm hurts more when raising it. Denies nausea, vomiting, diarrhea, urinary symptoms. Pt went to see a chiropractor last week.

## 2016-12-13 NOTE — ED Notes (Signed)
Bed: WA11 Expected date:  Expected time:  Means of arrival:  Comments: 

## 2016-12-13 NOTE — Discharge Instructions (Signed)
Take 4 over the counter ibuprofen tablets 3 times a day or 2 over-the-counter naproxen tablets twice a day for pain. Also take tylenol 1000mg(2 extra strength) four times a day.    

## 2016-12-13 NOTE — ED Provider Notes (Signed)
[facility] DEPT Provider Note   CSN: [id number] Arrival date & time: 12/13/16  0440     History   Chief Complaint Chief Complaint  Patient presents with  . Generalized Body Aches    HPI Emma Harris is a 51 y.o. female.  51 yo F with a cc of feeling weird.  Unable to describe further.  Going on for past week.  Denies chest pain, sob, abdominal pain, n/v/d.  Mild headache similar to prior.  Denies other complaint.  Woke her up from sleep.  Nothing makes it better or worse.  Also complaining of R shoulder pain with extreme forward flexion.  Pain to anterior aspect.  Denies injury.    The history is provided by the patient.  Illness  This is a new problem. The current episode started more than 1 week ago. The problem occurs constantly. The problem has not changed since onset.Pertinent negatives include no chest pain, no headaches and no shortness of breath. Nothing aggravates the symptoms. Nothing relieves the symptoms. She has tried nothing for the symptoms. The treatment provided no relief.    No past medical history on file.  There are no active problems to display for this patient.   Past Surgical History:  Procedure Laterality Date  . CHOLECYSTECTOMY  2006  . FRACTURE SURGERY Left    Knee    OB History    No data available       Home Medications    Prior to Admission medications   Medication Sig Start Date End Date Taking? Authorizing Provider  cyclobenzaprine (FLEXERIL) 10 MG tablet Take 1 tablet (10 mg total) by mouth 3 (three) times daily as needed for muscle spasms (or pain). Patient not taking: Reported on 12/13/2016 09/06/16   Clayton Bibles, PA-C  meloxicam (MOBIC) 7.5 MG tablet Take 1 tablet (7.5 mg total) by mouth daily as needed for pain. Patient not taking: Reported on 12/13/2016 09/06/16   Clayton Bibles, PA-C    Family History No family history on file.  Social History Social History  Substance Use Topics  . Smoking status: Never Smoker  .  Smokeless tobacco: Never Used  . Alcohol use No     Comment: rare     Allergies   Shellfish allergy and Codeine   Review of Systems Review of Systems  Constitutional: Negative for chills and fever.       Feels weird  HENT: Negative for congestion and rhinorrhea.   Eyes: Negative for redness and visual disturbance.  Respiratory: Negative for shortness of breath and wheezing.   Cardiovascular: Negative for chest pain and palpitations.  Gastrointestinal: Negative for nausea and vomiting.  Genitourinary: Negative for dysuria and urgency.  Musculoskeletal: Negative for arthralgias and myalgias.  Skin: Negative for pallor and wound.  Neurological: Negative for dizziness and headaches.     Physical Exam Updated Vital Signs BP (!) 149/99 (BP Location: Left Arm)   Pulse 82   Temp 98.3 F (36.8 C) (Oral)   Resp 15   Ht 5\' 4"  (1.626 m)   Wt 73 kg (161 lb)   SpO2 100%   BMI 27.64 kg/m   Physical Exam  Constitutional: She is oriented to person, place, and time. She appears well-developed and well-nourished. No distress.  HENT:  Head: Normocephalic and atraumatic.  Eyes: EOM are normal. Pupils are equal, round, and reactive to light.  Neck: Normal range of motion. Neck supple.  Cardiovascular: Normal rate and regular rhythm.  Exam reveals no gallop and no friction  rub.   No murmur heard. Pulmonary/Chest: Effort normal. She has no wheezes. She has no rales.  Abdominal: Soft. She exhibits no distension and no mass. There is tenderness in the epigastric area. There is no guarding.  Musculoskeletal: She exhibits no edema or tenderness.       Arms: Neurological: She is alert and oriented to person, place, and time. She has normal strength. No cranial nerve deficit or sensory deficit. Coordination and gait normal. She displays no Babinski's sign on the right side. She displays no Babinski's sign on the left side.  Reflex Scores:      Tricep reflexes are 2+ on the right side and 2+ on  the left side.      Bicep reflexes are 2+ on the right side and 2+ on the left side.      Brachioradialis reflexes are 2+ on the right side and 2+ on the left side.      Patellar reflexes are 2+ on the right side and 2+ on the left side.      Achilles reflexes are 2+ on the right side and 2+ on the left side. Skin: Skin is warm and dry. She is not diaphoretic.  Psychiatric: She has a normal mood and affect. Her behavior is normal.  Nursing note and vitals reviewed.    ED Treatments / Results  Labs (all labs ordered are listed, but only abnormal results are displayed) Labs Reviewed - No data to display  EKG  EKG Interpretation None       Radiology No results found.  Procedures Procedures (including critical care time)  Medications Ordered in ED Medications - No data to display   Initial Impression / Assessment and Plan / ED Course  I have reviewed the triage vital signs and the nursing notes.  Pertinent labs & imaging results that were available during my care of the patient were reviewed by me and considered in my medical decision making (see chart for details).     51 yo F with not feeling right.  Unable to quantify further.  Well appearing, non toxic.  Vitals unremarkable.  Mild epigastric abdominal pain with patient attributes to seeing a chiropractor recently.  Also complaining of R shoulder pain, worse with extreme forward flexion.  No neuro deficits.  Offered labs though with no other symptoms difficult to identify what we would be looking for.  Patient elects for no labs at this time.  Will follow up with PCP.   8:27 AM:  I have discussed the diagnosis/risks/treatment options with the patient and family and believe the pt to be eligible for discharge home to follow-up with PCP. We also discussed returning to the ED immediately if new or worsening sx occur. We discussed the sx which are most concerning (e.g., sudden worsening pain, fever, inability to tolerate by  mouth) that necessitate immediate return. Medications administered to the patient during their visit and any new prescriptions provided to the patient are listed below.  Medications given during this visit Medications - No data to display   The patient appears reasonably screen and/or stabilized for discharge and I doubt any other medical condition or other [facility] requiring further screening, evaluation, or treatment in the ED at this time prior to discharge.    Final Clinical Impressions(s) / ED Diagnoses   Final diagnoses:  Does not feel right  Acute pain of right shoulder    New Prescriptions New Prescriptions   No medications on file     [place],  Linna Hoff, DO 12/13/16 9969

## 2016-12-30 ENCOUNTER — Emergency Department (HOSPITAL_COMMUNITY)
Admission: EM | Admit: 2016-12-30 | Discharge: 2016-12-30 | Disposition: A | Discharge status: Home / Self Care | Payer: BLUE CROSS/BLUE SHIELD | Attending: Emergency Medicine | Admitting: Emergency Medicine

## 2016-12-30 ENCOUNTER — Encounter (HOSPITAL_COMMUNITY): Payer: Self-pay | Admitting: Emergency Medicine

## 2016-12-30 DIAGNOSIS — Z5321 Procedure and treatment not carried out due to patient leaving prior to being seen by health care provider: Secondary | ICD-10-CM | POA: Insufficient documentation

## 2016-12-30 NOTE — ED Triage Notes (Signed)
Pt from home with concerns regarding her blood pressure. Pt denies headache or other pain at this time. Pt states she checked her bp at [facility] today and her systolic was around 245. Pt states she then drank caffeine and she felt as if her bp rose. Pt is not hypertensive at time of assessment (137/87)

## 2017-07-19 DIAGNOSIS — Z1211 Encounter for screening for malignant neoplasm of colon: Secondary | ICD-10-CM | POA: Diagnosis not present

## 2017-07-19 DIAGNOSIS — Z800 Family history of malignant neoplasm of digestive organs: Secondary | ICD-10-CM | POA: Diagnosis not present

## 2017-07-24 ENCOUNTER — Ambulatory Visit: Payer: Self-pay | Admitting: Podiatry

## 2017-07-24 ENCOUNTER — Encounter: Payer: Self-pay | Admitting: Podiatry

## 2017-07-24 ENCOUNTER — Ambulatory Visit (INDEPENDENT_AMBULATORY_CARE_PROVIDER_SITE_OTHER): Payer: Self-pay

## 2017-07-24 DIAGNOSIS — M778 Other enthesopathies, not elsewhere classified: Secondary | ICD-10-CM

## 2017-07-24 DIAGNOSIS — M779 Enthesopathy, unspecified: Principal | ICD-10-CM

## 2017-07-24 DIAGNOSIS — M7750 Other enthesopathy of unspecified foot: Secondary | ICD-10-CM

## 2017-07-24 MED ORDER — METHYLPREDNISOLONE 4 MG PO TBPK: ORAL_TABLET | ORAL | 0 refills | Status: DC

## 2017-07-24 MED ORDER — MELOXICAM 15 MG PO TABS: 15.0000 mg | ORAL_TABLET | Freq: Every day | ORAL | 3 refills | Status: DC

## 2017-07-24 NOTE — Progress Notes (Signed)
  Subjective:  Patient ID: Emma Harris, female    DOB: [birth year],  MRN: [id number] HPI Chief Complaint  Patient presents with  . Foot Pain    1st toe and MPJ bilateral (R>L) - sharp, radiating pain along dorsal and medial side, cramping hallux, tried elevating at night and rest, also having leg pains    52 y.o. female presents with the above complaint.     No past medical history on file. Past Surgical History:  Procedure Laterality Date  . CHOLECYSTECTOMY  2006  . FRACTURE SURGERY Left    Knee   No current outpatient medications on file.  Allergies  Allergen Reactions  . Shellfish Allergy Anaphylaxis  . Codeine Other (See Comments)    Pt states that this medication makes her "crazy".    Review of Systems  All other systems reviewed and are negative.  Objective:  There were no vitals filed for this visit.  General: Well developed, nourished, in no acute distress, alert and oriented x3   Dermatological: Skin is warm, dry and supple bilateral. Nails x 10 are well maintained; remaining integument appears unremarkable at this time. There are no open sores, no preulcerative lesions, no rash or signs of infection present.  Vascular: Dorsalis Pedis artery and Posterior Tibial artery pedal pulses are 2/4 bilateral with immedate capillary fill time. Pedal hair growth present. No varicosities and no lower extremity edema present bilateral.   Neruologic: Grossly intact via light touch bilateral. Vibratory intact via tuning fork bilateral. Protective threshold with Semmes Wienstein monofilament intact to all pedal sites bilateral. Patellar and Achilles deep tendon reflexes 2+ bilateral. No Babinski or clonus noted bilateral.   Musculoskeletal: No gross boney pedal deformities bilateral. No pain, crepitus, or limitation noted with foot and ankle range of motion bilateral. Muscular strength 5/5 in all groups tested bilateral.  Gait: Unassisted, Nonantalgic.     Radiographs:  Radiographs 3 views of bilateral foot taken today demonstrate mild pes planus with some joint space narrowing of the first metatarsophalangeal joint.  Very little in the way of osteoarthritic change in this area.  No acute osseous changes.    Assessment & Plan:   Assessment: Capsulitis of the first metatarsophalangeal joint bilateral and stenosing tenosynovitis of the FHL.  Plan: Discussed etiology pathology conservative versus surgical therapies.  I recommended an intra-articular injection or an A1 pulley injection however she declined.  I did place her on a Medrol Dosepak to be followed by meloxicam.  Discussed appropriate shoe gear with a stiff sole.  She understands this is amenable to it we will follow-up with me in the near future.     Elvis Laufer T. [place], [place]

## 2017-08-06 DIAGNOSIS — Z1231 Encounter for screening mammogram for malignant neoplasm of breast: Secondary | ICD-10-CM | POA: Diagnosis not present

## 2017-08-06 DIAGNOSIS — R350 Frequency of micturition: Secondary | ICD-10-CM | POA: Diagnosis not present

## 2017-08-06 DIAGNOSIS — Z124 Encounter for screening for malignant neoplasm of cervix: Secondary | ICD-10-CM | POA: Diagnosis not present

## 2017-08-06 DIAGNOSIS — Z01419 Encounter for gynecological examination (general) (routine) without abnormal findings: Secondary | ICD-10-CM | POA: Diagnosis not present

## 2017-08-06 DIAGNOSIS — D259 Leiomyoma of uterus, unspecified: Secondary | ICD-10-CM | POA: Diagnosis not present

## 2017-08-06 DIAGNOSIS — R319 Hematuria, unspecified: Secondary | ICD-10-CM | POA: Diagnosis not present

## 2017-08-15 DIAGNOSIS — Z1211 Encounter for screening for malignant neoplasm of colon: Secondary | ICD-10-CM | POA: Diagnosis not present

## 2017-08-15 DIAGNOSIS — Z800 Family history of malignant neoplasm of digestive organs: Secondary | ICD-10-CM | POA: Diagnosis not present

## 2017-08-15 LAB — HM COLONOSCOPY

## 2017-08-23 ENCOUNTER — Ambulatory Visit: Payer: Self-pay | Admitting: Podiatry

## 2017-09-04 ENCOUNTER — Encounter: Payer: Self-pay | Admitting: Podiatry

## 2017-09-04 ENCOUNTER — Ambulatory Visit: Payer: BLUE CROSS/BLUE SHIELD | Admitting: Podiatry

## 2017-09-04 DIAGNOSIS — M778 Other enthesopathies, not elsewhere classified: Secondary | ICD-10-CM

## 2017-09-04 DIAGNOSIS — M7750 Other enthesopathy of unspecified foot: Secondary | ICD-10-CM

## 2017-09-04 DIAGNOSIS — M779 Enthesopathy, unspecified: Principal | ICD-10-CM

## 2017-09-04 NOTE — Progress Notes (Signed)
She states that she presents today for follow-up of her capsulitis.  She states that the fever getting better that she stopped taking the medicine for a little while but is starting to hurt again.  Objective: Vital signs are stable alert and oriented x3.  Pulses are palpable.  Neurologic sensorium is intact.  Deep tendon reflexes are intact.  No significant findings today on physical exam.  Assessment: Capsulitis resolving.  Plan: Follow-up with her in 2 months if not completely improved and consider MRI or CT scan.  May need to consider orthotics.

## 2017-09-11 DIAGNOSIS — R7989 Other specified abnormal findings of blood chemistry: Secondary | ICD-10-CM | POA: Diagnosis not present

## 2017-09-11 DIAGNOSIS — R5383 Other fatigue: Secondary | ICD-10-CM | POA: Diagnosis not present

## 2017-09-11 DIAGNOSIS — R030 Elevated blood-pressure reading, without diagnosis of hypertension: Secondary | ICD-10-CM | POA: Diagnosis not present

## 2017-10-16 DIAGNOSIS — L0291 Cutaneous abscess, unspecified: Secondary | ICD-10-CM | POA: Diagnosis not present

## 2017-10-16 DIAGNOSIS — I10 Essential (primary) hypertension: Secondary | ICD-10-CM | POA: Diagnosis not present

## 2017-11-06 ENCOUNTER — Ambulatory Visit: Payer: BLUE CROSS/BLUE SHIELD | Admitting: Podiatry

## 2017-11-15 DIAGNOSIS — I10 Essential (primary) hypertension: Secondary | ICD-10-CM | POA: Diagnosis not present

## 2017-12-26 DIAGNOSIS — N952 Postmenopausal atrophic vaginitis: Secondary | ICD-10-CM | POA: Diagnosis not present

## 2017-12-26 DIAGNOSIS — R3915 Urgency of urination: Secondary | ICD-10-CM | POA: Diagnosis not present

## 2017-12-26 DIAGNOSIS — N941 Unspecified dyspareunia: Secondary | ICD-10-CM | POA: Diagnosis not present

## 2017-12-26 DIAGNOSIS — D259 Leiomyoma of uterus, unspecified: Secondary | ICD-10-CM | POA: Diagnosis not present

## 2018-02-06 DIAGNOSIS — D259 Leiomyoma of uterus, unspecified: Secondary | ICD-10-CM | POA: Diagnosis not present

## 2018-02-06 DIAGNOSIS — N952 Postmenopausal atrophic vaginitis: Secondary | ICD-10-CM | POA: Diagnosis not present

## 2018-03-07 ENCOUNTER — Other Ambulatory Visit: Payer: Self-pay | Admitting: Obstetrics and Gynecology

## 2018-03-07 DIAGNOSIS — R9389 Abnormal findings on diagnostic imaging of other specified body structures: Secondary | ICD-10-CM | POA: Diagnosis not present

## 2018-03-07 DIAGNOSIS — N941 Unspecified dyspareunia: Secondary | ICD-10-CM | POA: Diagnosis not present

## 2018-03-07 DIAGNOSIS — N951 Menopausal and female climacteric states: Secondary | ICD-10-CM | POA: Diagnosis not present

## 2018-03-07 DIAGNOSIS — R350 Frequency of micturition: Secondary | ICD-10-CM | POA: Diagnosis not present

## 2018-03-07 DIAGNOSIS — N710 Acute inflammatory disease of uterus: Secondary | ICD-10-CM | POA: Diagnosis not present

## 2018-04-10 DIAGNOSIS — R3915 Urgency of urination: Secondary | ICD-10-CM | POA: Diagnosis not present

## 2018-04-10 DIAGNOSIS — R351 Nocturia: Secondary | ICD-10-CM | POA: Diagnosis not present

## 2018-04-24 ENCOUNTER — Other Ambulatory Visit: Payer: Self-pay | Admitting: Podiatry

## 2018-05-22 DIAGNOSIS — R682 Dry mouth, unspecified: Secondary | ICD-10-CM | POA: Diagnosis not present

## 2018-05-22 DIAGNOSIS — R351 Nocturia: Secondary | ICD-10-CM | POA: Diagnosis not present

## 2018-05-22 DIAGNOSIS — R3915 Urgency of urination: Secondary | ICD-10-CM | POA: Diagnosis not present

## 2018-05-28 DIAGNOSIS — B349 Viral infection, unspecified: Secondary | ICD-10-CM | POA: Diagnosis not present

## 2018-05-28 DIAGNOSIS — Z6827 Body mass index (BMI) 27.0-27.9, adult: Secondary | ICD-10-CM | POA: Diagnosis not present

## 2018-05-28 DIAGNOSIS — R109 Unspecified abdominal pain: Secondary | ICD-10-CM | POA: Diagnosis not present

## 2018-05-28 DIAGNOSIS — R5383 Other fatigue: Secondary | ICD-10-CM | POA: Diagnosis not present

## 2018-05-28 DIAGNOSIS — N390 Urinary tract infection, site not specified: Secondary | ICD-10-CM | POA: Diagnosis not present

## 2018-05-28 DIAGNOSIS — R350 Frequency of micturition: Secondary | ICD-10-CM | POA: Diagnosis not present

## 2018-05-28 DIAGNOSIS — Z79899 Other long term (current) drug therapy: Secondary | ICD-10-CM | POA: Diagnosis not present

## 2018-05-28 DIAGNOSIS — D7589 Other specified diseases of blood and blood-forming organs: Secondary | ICD-10-CM | POA: Diagnosis not present

## 2018-05-29 ENCOUNTER — Emergency Department (HOSPITAL_COMMUNITY): Payer: BLUE CROSS/BLUE SHIELD

## 2018-05-29 ENCOUNTER — Encounter (HOSPITAL_COMMUNITY): Payer: Self-pay | Admitting: Emergency Medicine

## 2018-05-29 ENCOUNTER — Emergency Department (HOSPITAL_COMMUNITY)
Admission: EM | Admit: 2018-05-29 | Discharge: 2018-05-29 | Disposition: A | Discharge status: Home / Self Care | Payer: BLUE CROSS/BLUE SHIELD | Attending: Emergency Medicine | Admitting: Emergency Medicine

## 2018-05-29 ENCOUNTER — Other Ambulatory Visit: Payer: Self-pay

## 2018-05-29 DIAGNOSIS — B349 Viral infection, unspecified: Secondary | ICD-10-CM

## 2018-05-29 DIAGNOSIS — R109 Unspecified abdominal pain: Secondary | ICD-10-CM

## 2018-05-29 DIAGNOSIS — D7589 Other specified diseases of blood and blood-forming organs: Secondary | ICD-10-CM

## 2018-05-29 LAB — BASIC METABOLIC PANEL
ANION GAP: 7 (ref 5–15)
BUN: 13 mg/dL (ref 6–20)
CALCIUM: 9.9 mg/dL (ref 8.9–10.3)
CO2: 25 mmol/L (ref 22–32)
Chloride: 107 mmol/L (ref 98–111)
Creatinine, Ser: 0.61 mg/dL (ref 0.44–1.00)
Glucose, Bld: 104 mg/dL — ABNORMAL HIGH (ref 70–99)
POTASSIUM: 3.9 mmol/L (ref 3.5–5.1)
Sodium: 139 mmol/L (ref 135–145)

## 2018-05-29 LAB — URINALYSIS, ROUTINE W REFLEX MICROSCOPIC
BILIRUBIN URINE: NEGATIVE
Bacteria, UA: NONE SEEN
GLUCOSE, UA: NEGATIVE mg/dL
KETONES UR: NEGATIVE mg/dL
NITRITE: NEGATIVE
PH: 7 (ref 5.0–8.0)
PROTEIN: NEGATIVE mg/dL
Specific Gravity, Urine: 1.012 (ref 1.005–1.030)

## 2018-05-29 LAB — CBC
HCT: 38.7 % (ref 36.0–46.0)
HEMOGLOBIN: 12.4 g/dL (ref 12.0–15.0)
MCH: 33.5 pg (ref 26.0–34.0)
MCHC: 32 g/dL (ref 30.0–36.0)
MCV: 104.6 fL — ABNORMAL HIGH (ref 80.0–100.0)
NRBC: 0 % (ref 0.0–0.2)
PLATELETS: 350 10*3/uL (ref 150–400)
RBC: 3.7 MIL/uL — AB (ref 3.87–5.11)
RDW: 11.4 % — ABNORMAL LOW (ref 11.5–15.5)
WBC: 7 10*3/uL (ref 4.0–10.5)

## 2018-05-29 MED ORDER — IBUPROFEN 200 MG PO TABS
400.0000 mg | ORAL_TABLET | Freq: Once | ORAL | Status: AC
  Administered 2018-05-29: 400 mg via ORAL
  Filled 2018-05-29: qty 2

## 2018-05-29 NOTE — ED Provider Notes (Signed)
[facility] DEPT Provider Note   CSN: [id number] Arrival date & time: 05/28/18  2337     History   Chief Complaint Chief Complaint  Patient presents with  . Flank Pain    HPI Emma Harris is a 52 y.o. female.  The history is provided by the patient.  Yesterday, she noted that she was having trouble organizing her thoughts.  Today she developed some urinary frequency and went to urgent care where she was diagnosed with urinary tract infection and given prescription for an antibiotic.  However, she was not able to pick the prescription up before the pharmacy closed.  Tonight, she developed pain in her right flank and chills.  She denies fever or sweats.  She denies nausea or vomiting.  She rates her flank pain at 4/10.  Nothing makes it better, nothing makes it worse.  History reviewed. No pertinent past medical history.  There are no active problems to display for this patient.   Past Surgical History:  Procedure Laterality Date  . CHOLECYSTECTOMY  2006  . FRACTURE SURGERY Left    Knee     OB History   None      Home Medications    Prior to Admission medications   Medication Sig Start Date End Date Taking? Authorizing Provider  amLODipine (NORVASC) 5 MG tablet Take 5 mg by mouth daily. 05/28/18  Yes [provider]  meloxicam (MOBIC) 15 MG tablet Take 1 tablet (15 mg total) by mouth daily. 07/24/17  Yes Hyatt, Max T, DPM  oxybutynin (DITROPAN XL) 15 MG 24 hr tablet Take 15 mg by mouth daily. 04/10/18  Yes [provider]    Family History No family history on file.  Social History Social History   Tobacco Use  . Smoking status: Never Smoker  . Smokeless tobacco: Never Used  Substance Use Topics  . Alcohol use: No    Comment: rare  . Drug use: No     Allergies   Shellfish allergy and Codeine   Review of Systems Review of Systems  All other systems reviewed and are negative.    Physical  Exam Updated Vital Signs BP (!) 137/91 (BP Location: Left Arm)   Pulse 74   Temp 97.7 F (36.5 C) (Oral)   Resp 16   Ht 5\' 4"  (1.626 m)   Wt 73.5 kg   SpO2 98%   BMI 27.81 kg/m   Physical Exam  Nursing note and vitals reviewed.  52 year old female, resting comfortably and in no acute distress. Vital signs are significant for borderline elevated blood pressure. Oxygen saturation is 98%, which is normal. Head is normocephalic and atraumatic. PERRLA, EOMI. Oropharynx is clear. Neck is nontender and supple without adenopathy or JVD. Back is nontender in the midline.  There is mild right CVA tenderness. Lungs are clear without rales, wheezes, or rhonchi. Chest is nontender. Heart has regular rate and rhythm without murmur. Abdomen is soft, flat, nontender without masses or hepatosplenomegaly and peristalsis is hypoactive. Extremities have no cyanosis or edema, full range of motion is present. Skin is warm and dry without rash. Neurologic: Mental status is normal, cranial nerves are intact, there are no motor or sensory deficits.  ED Treatments / Results  Labs (all labs ordered are listed, but only abnormal results are displayed) Labs Reviewed  URINALYSIS, ROUTINE W REFLEX MICROSCOPIC - Abnormal; Notable for the following components:      Result Value   Color, Urine STRAW (*)  Hgb urine dipstick SMALL (*)    Leukocytes, UA TRACE (*)    All other components within normal limits  BASIC METABOLIC PANEL - Abnormal; Notable for the following components:   Glucose, Bld 104 (*)    All other components within normal limits  CBC - Abnormal; Notable for the following components:   RBC 3.70 (*)    MCV 104.6 (*)    RDW 11.4 (*)    All other components within normal limits  URINE CULTURE   Radiology Ct Renal Stone Study  Result Date: 05/29/2018 CLINICAL DATA:  RIGHT flank pain, chills, frequent urination. Recent diagnosis of urinary tract infection, not on medication. History of  cholecystectomy. EXAM: CT ABDOMEN AND PELVIS WITHOUT CONTRAST TECHNIQUE: Multidetector CT imaging of the abdomen and pelvis was performed following the standard protocol without IV contrast. COMPARISON:  CT abdomen and pelvis March 02, 2016 FINDINGS: LOWER CHEST: Lung bases are clear. The visualized heart size is normal. No pericardial effusion. HEPATOBILIARY: Status post cholecystectomy. Subcentimeter calcification dome of liver, otherwise unremarkable. PANCREAS: Normal. SPLEEN: Normal. ADRENALS/URINARY TRACT: Kidneys are orthotopic, demonstrating normal size and morphology. 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 No nephrolithiasis, hydronephrosis; limited assessment for renal masses by nonenhanced CT. The unopacified ureters are normal in course and caliber. Urinary bladder is partially distended and unremarkable. Normal adrenal glands. STOMACH/BOWEL: Small hiatal hernia. The stomach, small and large bowel are normal in course and caliber without inflammatory changes, sensitivity decreased by lack of enteric contrast. Small volume retained large bowel stool. Small amount of small bowel feces seen with chronic stasis. Normal appendix. VASCULAR/LYMPHATIC: Aortoiliac vessels are normal in course and caliber. No lymphadenopathy by CT size criteria. 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 OTHER: No intraperitoneal free fluid or free air. MUSCULOSKELETAL: Non-acute. Dextroscoliosis thoracic spine. Mild sacroiliac osteoarthrosis. IMPRESSION: 1. No nephrolithiasis, obstructive uropathy nor acute intra-abdominal/pelvic process. Electronically Signed   By: Elon Alas M.D.   On: 05/29/2018 03:15    Procedures Procedures   Medications Ordered in ED Medications  ibuprofen (ADVIL,MOTRIN) tablet 400 mg (400 mg Oral Given 05/29/18 0221)     Initial Impression / Assessment and Plan / ED Course  I have reviewed the triage vital signs  and the nursing notes.  Pertinent labs & imaging results that were available during my care of the patient were reviewed by me and considered in my medical decision making (see chart for details).  Right flank pain with urinary frequency.  Urinalysis does not show evidence of UTI.  Screening labs show normal electrolytes, BUN, creatinine, WBC, hemoglobin, platelets.  Incidental finding of macrocytosis without anemia.  She will be sent for renal stone protocol CT scan.  Old records are reviewed, and she has no relevant past visits.  CT scan of abdomen and pelvis in September 2017 did not show evidence of nephrolithiasis.  CT scan shows no acute process.  Patient states that she is feeling well.  This appears to be a viral illness.  She is advised to continue using over-the-counter analgesics as needed for pain, return precautions discussed.  Final Clinical Impressions(s) / ED Diagnoses   Final diagnoses:  Right flank pain  Macrocytosis without anemia  Viral illness    ED Discharge Orders    None       Delora Fuel, MD 25/36/64 [phone]

## 2018-05-29 NOTE — ED Triage Notes (Signed)
Pt arriving POV from home with complaint of right sided flank pain, chills, and frequent urination. Pt was diagnosed with UTI today at Urgent Care and was given a prescription for antibiotics. Pt reports she has not picked up her prescription.

## 2018-05-29 NOTE — Discharge Instructions (Addendum)
Your evaluation in the emergency department did not show signs of urinary infection, or any other serious problem.  I believe that you have a virus.  Please drink plenty of fluids, take over-the-counter medications such as acetaminophen, ibuprofen, naproxen as needed for pain.  Return if symptoms worsen.

## 2018-05-30 LAB — URINE CULTURE: Culture: <10000 — AB

## 2018-08-07 DIAGNOSIS — Z01411 Encounter for gynecological examination (general) (routine) with abnormal findings: Secondary | ICD-10-CM | POA: Diagnosis not present

## 2018-08-07 DIAGNOSIS — L0292 Furuncle, unspecified: Secondary | ICD-10-CM | POA: Diagnosis not present

## 2018-08-07 DIAGNOSIS — Z1231 Encounter for screening mammogram for malignant neoplasm of breast: Secondary | ICD-10-CM | POA: Diagnosis not present

## 2018-08-07 DIAGNOSIS — Z6829 Body mass index (BMI) 29.0-29.9, adult: Secondary | ICD-10-CM | POA: Diagnosis not present

## 2018-08-20 DIAGNOSIS — M9902 Segmental and somatic dysfunction of thoracic region: Secondary | ICD-10-CM | POA: Diagnosis not present

## 2018-08-20 DIAGNOSIS — M5384 Other specified dorsopathies, thoracic region: Secondary | ICD-10-CM | POA: Diagnosis not present

## 2018-08-20 DIAGNOSIS — M9903 Segmental and somatic dysfunction of lumbar region: Secondary | ICD-10-CM | POA: Diagnosis not present

## 2018-08-20 DIAGNOSIS — M545 Low back pain: Secondary | ICD-10-CM | POA: Diagnosis not present

## 2018-08-21 ENCOUNTER — Other Ambulatory Visit: Payer: Self-pay

## 2018-08-21 ENCOUNTER — Emergency Department (HOSPITAL_COMMUNITY)
Admission: EM | Admit: 2018-08-21 | Discharge: 2018-08-21 | Disposition: A | Discharge status: Home / Self Care | Payer: BLUE CROSS/BLUE SHIELD | Attending: Emergency Medicine | Admitting: Emergency Medicine

## 2018-08-21 ENCOUNTER — Encounter (HOSPITAL_COMMUNITY): Payer: Self-pay

## 2018-08-21 DIAGNOSIS — R350 Frequency of micturition: Secondary | ICD-10-CM | POA: Diagnosis not present

## 2018-08-21 DIAGNOSIS — R197 Diarrhea, unspecified: Secondary | ICD-10-CM | POA: Diagnosis not present

## 2018-08-21 DIAGNOSIS — Z79899 Other long term (current) drug therapy: Secondary | ICD-10-CM | POA: Diagnosis not present

## 2018-08-21 DIAGNOSIS — I10 Essential (primary) hypertension: Secondary | ICD-10-CM | POA: Diagnosis not present

## 2018-08-21 DIAGNOSIS — R6883 Chills (without fever): Secondary | ICD-10-CM | POA: Diagnosis not present

## 2018-08-21 LAB — URINALYSIS, ROUTINE W REFLEX MICROSCOPIC
BACTERIA UA: NONE SEEN
BILIRUBIN URINE: NEGATIVE
Glucose, UA: NEGATIVE mg/dL
Hgb urine dipstick: NEGATIVE
KETONES UR: NEGATIVE mg/dL
Nitrite: NEGATIVE
Protein, ur: NEGATIVE mg/dL
Specific Gravity, Urine: 1.008 (ref 1.005–1.030)
pH: 7 (ref 5.0–8.0)

## 2018-08-21 MED ORDER — SODIUM CHLORIDE 0.9% FLUSH: 3.0000 mL | Freq: Once | INTRAVENOUS | Status: DC

## 2018-08-21 NOTE — ED Provider Notes (Signed)
[facility] DEPT Provider Note   CSN: [id number] Arrival date & time: 08/21/18  1696    History   Chief Complaint Chief Complaint  Patient presents with  . Chills  . Diarrhea    HPI Emma Harris is a 53 y.o. female.     HPI   Pt is a 53 y/o female who presents to the ED today c/o chills and diarrhea that began last night. States she had two small episodes of diarrhea. She has had no further episodes of diarrhea. She no longer has the chills. No vomiting or abd pain. She reports some urinary frequency (chronic for almost a year, on oxybutynin for this). Denies dysuria, hematuria or urgency.   States her roommate was sick last week with similar sxs. No recent foreign travel. No recent consumption of suspect foods.   History reviewed. No pertinent past medical history.  There are no active problems to display for this patient.   Past Surgical History:  Procedure Laterality Date  . CHOLECYSTECTOMY  2006  . FRACTURE SURGERY Left    Knee     OB History   No obstetric history on file.      Home Medications    Prior to Admission medications   Medication Sig Start Date End Date Taking? Authorizing Provider  amLODipine (NORVASC) 5 MG tablet Take 5 mg by mouth daily. 05/28/18  Yes [provider]    Family History Family History  Problem Relation Age of Onset  . Hypertension Mother     Social History Social History   Tobacco Use  . Smoking status: Never Smoker  . Smokeless tobacco: Never Used  Substance Use Topics  . Alcohol use: Yes    Comment: rare  . Drug use: No     Allergies   Shellfish allergy and Codeine   Review of Systems Review of Systems  Constitutional: Positive for chills (resolved). Negative for fever.  HENT: Negative for sore throat.   Eyes: Negative for visual disturbance.  Respiratory: Negative for cough and shortness of breath.   Cardiovascular: Negative for chest pain.  Gastrointestinal:  Positive for diarrhea. Negative for abdominal pain, blood in stool, constipation, nausea and vomiting.  Genitourinary: Positive for frequency (chronic). Negative for dysuria, hematuria and urgency.  Musculoskeletal: Negative for back pain.  Skin: Negative for rash.  Neurological: Negative for headaches.  All other systems reviewed and are negative.    Physical Exam Updated Vital Signs BP (!) 141/91 (BP Location: Left Arm)   Pulse 78   Temp 98.5 F (36.9 C) (Oral)   Resp 16   Ht 5' (1.524 m)   Wt 72.6 kg   SpO2 100%   BMI 31.25 kg/m   Physical Exam Vitals signs and nursing note reviewed.  Constitutional:      General: She is not in acute distress.    Appearance: She is well-developed. She is not ill-appearing or toxic-appearing.  HENT:     Head: Normocephalic and atraumatic.  Eyes:     Conjunctiva/sclera: Conjunctivae normal.  Neck:     Musculoskeletal: Neck supple.  Cardiovascular:     Rate and Rhythm: Normal rate and regular rhythm.     Heart sounds: Normal heart sounds. No murmur.  Pulmonary:     Effort: Pulmonary effort is normal. No respiratory distress.     Breath sounds: Normal breath sounds. No stridor.  Abdominal:     General: Bowel sounds are normal. There is no distension.     Palpations: Abdomen  is soft.     Tenderness: There is no abdominal tenderness. There is no right CVA tenderness, left CVA tenderness, guarding or rebound.  Skin:    General: Skin is warm and dry.  Neurological:     Mental Status: She is alert.     ED Treatments / Results  Labs (all labs ordered are listed, but only abnormal results are displayed) Labs Reviewed  URINALYSIS, ROUTINE W REFLEX MICROSCOPIC - Abnormal; Notable for the following components:      Result Value   Color, Urine STRAW (*)    Leukocytes,Ua SMALL (*)    All other components within normal limits  URINE CULTURE    EKG None  Radiology No results found.  Procedures Procedures (including critical care  time)  Medications Ordered in ED Medications  sodium chloride flush (NS) 0.9 % injection 3 mL (has no administration in time range)     Initial Impression / Assessment and Plan / ED Course  I have reviewed the triage vital signs and the nursing notes.  Pertinent labs & imaging results that were available during my care of the patient were reviewed by me and considered in my medical decision making (see chart for details).    Final Clinical Impressions(s) / ED Diagnoses   Final diagnoses:  Diarrhea, unspecified type   Patient presenting to the ED for evaluation of 2 episodes of diarrhea and chills that started last night.  She has no abdominal pain.  No vomiting.  She reports urinary frequency but states that this is a chronic problem for her.  She is requesting that we check her urine for urinary tract infection.  She has some trace leukocytes in her urine but otherwise only has 0-5 RBCs, 0-5 white blood cells, no bacteria.  I have very low suspicion for urinary tract infection.  I do not think that labs are indicated as patient has only had 2 episodes of diarrhea and therefore less likely to have significant electrolyte derangement.  Her abdomen is soft and nontender.  She is not febrile.  Vital signs are normal.  Suspect she has a viral illness.  Advised supportive therapy and hydration.  Advised to follow-up with her PCP and return to the ER for new or worsening symptoms.  She voices understanding of the plan and reasons to return.  All questions were answered.  Patient stable for discharge.  ED Discharge Orders    None       Bishop Dublin 08/21/18 3646    Dorie Rank, MD 08/22/18 [phone]

## 2018-08-21 NOTE — ED Notes (Signed)
[facility] Provider at bedside. 

## 2018-08-21 NOTE — Discharge Instructions (Addendum)
Your urinalysis did not show significant evidence of a urinary tract infection.   A culture was sent of your urine today to determine if there is any bacterial growth. If the results of the culture are positive and you require an antibiotic or a change of your prescribed antibiotic you will be contacted by the hospital. If the results are negative you will not be contacted.  Please follow up with your primary care provider within 5-7 days for re-evaluation of your symptoms. If you do not have a primary care provider, information for a healthcare clinic has been provided for you to make arrangements for follow up care. Please return to the emergency department for any new or worsening symptoms.

## 2018-08-21 NOTE — ED Triage Notes (Addendum)
Per EMS- Patient c/o chills and diarrhea when she woke this AM.  Patient added in triage that she has had intermittent abdominal cramping x 2 weeks.

## 2018-08-21 NOTE — ED Notes (Signed)
NO RX GIVEN, WORK NOTE GIVEN,

## 2018-08-22 LAB — URINE CULTURE

## 2018-08-23 DIAGNOSIS — N390 Urinary tract infection, site not specified: Secondary | ICD-10-CM | POA: Diagnosis not present

## 2018-08-23 DIAGNOSIS — R110 Nausea: Secondary | ICD-10-CM | POA: Diagnosis not present

## 2018-08-29 DIAGNOSIS — E669 Obesity, unspecified: Secondary | ICD-10-CM | POA: Diagnosis not present

## 2018-08-29 DIAGNOSIS — Z1331 Encounter for screening for depression: Secondary | ICD-10-CM | POA: Diagnosis not present

## 2018-08-29 DIAGNOSIS — R350 Frequency of micturition: Secondary | ICD-10-CM | POA: Diagnosis not present

## 2018-08-29 DIAGNOSIS — I10 Essential (primary) hypertension: Secondary | ICD-10-CM | POA: Diagnosis not present

## 2018-08-29 DIAGNOSIS — R7989 Other specified abnormal findings of blood chemistry: Secondary | ICD-10-CM | POA: Diagnosis not present

## 2018-09-05 DIAGNOSIS — R1013 Epigastric pain: Secondary | ICD-10-CM | POA: Diagnosis not present

## 2018-09-05 DIAGNOSIS — K5909 Other constipation: Secondary | ICD-10-CM | POA: Diagnosis not present

## 2018-09-10 DIAGNOSIS — M47819 Spondylosis without myelopathy or radiculopathy, site unspecified: Secondary | ICD-10-CM | POA: Diagnosis not present

## 2018-09-10 DIAGNOSIS — M47814 Spondylosis without myelopathy or radiculopathy, thoracic region: Secondary | ICD-10-CM | POA: Diagnosis not present

## 2018-10-24 DIAGNOSIS — M2141 Flat foot [pes planus] (acquired), right foot: Secondary | ICD-10-CM | POA: Diagnosis not present

## 2018-10-24 DIAGNOSIS — M9905 Segmental and somatic dysfunction of pelvic region: Secondary | ICD-10-CM | POA: Diagnosis not present

## 2018-10-24 DIAGNOSIS — M5384 Other specified dorsopathies, thoracic region: Secondary | ICD-10-CM | POA: Diagnosis not present

## 2018-10-24 DIAGNOSIS — M9902 Segmental and somatic dysfunction of thoracic region: Secondary | ICD-10-CM | POA: Diagnosis not present

## 2018-10-25 DIAGNOSIS — G8929 Other chronic pain: Secondary | ICD-10-CM | POA: Diagnosis not present

## 2018-10-25 DIAGNOSIS — M545 Low back pain: Secondary | ICD-10-CM | POA: Diagnosis not present

## 2019-01-24 DIAGNOSIS — I10 Essential (primary) hypertension: Secondary | ICD-10-CM | POA: Diagnosis not present

## 2019-01-24 DIAGNOSIS — Z01812 Encounter for preprocedural laboratory examination: Secondary | ICD-10-CM | POA: Diagnosis not present

## 2019-01-24 DIAGNOSIS — Z1159 Encounter for screening for other viral diseases: Secondary | ICD-10-CM | POA: Diagnosis not present

## 2019-01-31 DIAGNOSIS — R1013 Epigastric pain: Secondary | ICD-10-CM | POA: Diagnosis not present

## 2019-01-31 DIAGNOSIS — I10 Essential (primary) hypertension: Secondary | ICD-10-CM | POA: Diagnosis not present

## 2019-01-31 DIAGNOSIS — K3189 Other diseases of stomach and duodenum: Secondary | ICD-10-CM | POA: Diagnosis not present

## 2019-01-31 DIAGNOSIS — K2930 Chronic superficial gastritis without bleeding: Secondary | ICD-10-CM | POA: Diagnosis not present

## 2019-02-03 DIAGNOSIS — R109 Unspecified abdominal pain: Secondary | ICD-10-CM | POA: Diagnosis not present

## 2019-02-03 DIAGNOSIS — D259 Leiomyoma of uterus, unspecified: Secondary | ICD-10-CM | POA: Diagnosis not present

## 2019-02-03 DIAGNOSIS — R1084 Generalized abdominal pain: Secondary | ICD-10-CM | POA: Diagnosis not present

## 2019-02-05 DIAGNOSIS — R110 Nausea: Secondary | ICD-10-CM | POA: Diagnosis not present

## 2019-02-05 DIAGNOSIS — T404X5A Adverse effect of other synthetic narcotics, initial encounter: Secondary | ICD-10-CM | POA: Diagnosis not present

## 2019-02-05 DIAGNOSIS — Y999 Unspecified external cause status: Secondary | ICD-10-CM | POA: Diagnosis not present

## 2019-02-05 DIAGNOSIS — X58XXXA Exposure to other specified factors, initial encounter: Secondary | ICD-10-CM | POA: Diagnosis not present

## 2019-02-07 ENCOUNTER — Emergency Department (HOSPITAL_COMMUNITY): Payer: BLUE CROSS/BLUE SHIELD

## 2019-02-07 ENCOUNTER — Other Ambulatory Visit: Payer: Self-pay

## 2019-02-07 ENCOUNTER — Emergency Department (HOSPITAL_COMMUNITY)
Admission: EM | Admit: 2019-02-07 | Discharge: 2019-02-07 | Disposition: A | Discharge status: Home / Self Care | Payer: BLUE CROSS/BLUE SHIELD | Attending: Emergency Medicine | Admitting: Emergency Medicine

## 2019-02-07 ENCOUNTER — Encounter (HOSPITAL_COMMUNITY): Payer: Self-pay | Admitting: Emergency Medicine

## 2019-02-07 DIAGNOSIS — R1084 Generalized abdominal pain: Secondary | ICD-10-CM | POA: Diagnosis not present

## 2019-02-07 DIAGNOSIS — D219 Benign neoplasm of connective and other soft tissue, unspecified: Secondary | ICD-10-CM | POA: Insufficient documentation

## 2019-02-07 DIAGNOSIS — Z79899 Other long term (current) drug therapy: Secondary | ICD-10-CM | POA: Diagnosis not present

## 2019-02-07 DIAGNOSIS — K5900 Constipation, unspecified: Secondary | ICD-10-CM | POA: Diagnosis not present

## 2019-02-07 DIAGNOSIS — R102 Pelvic and perineal pain: Secondary | ICD-10-CM | POA: Diagnosis not present

## 2019-02-07 DIAGNOSIS — I10 Essential (primary) hypertension: Secondary | ICD-10-CM | POA: Diagnosis not present

## 2019-02-07 DIAGNOSIS — R1030 Lower abdominal pain, unspecified: Secondary | ICD-10-CM

## 2019-02-07 DIAGNOSIS — D259 Leiomyoma of uterus, unspecified: Secondary | ICD-10-CM | POA: Diagnosis not present

## 2019-02-07 DIAGNOSIS — R110 Nausea: Secondary | ICD-10-CM | POA: Diagnosis not present

## 2019-02-07 LAB — CBC
HCT: 36.9 % (ref 36.0–46.0)
Hemoglobin: 12.3 g/dL (ref 12.0–15.0)
MCH: 34.7 pg — ABNORMAL HIGH (ref 26.0–34.0)
MCHC: 33.3 g/dL (ref 30.0–36.0)
MCV: 104.2 fL — ABNORMAL HIGH (ref 80.0–100.0)
Platelets: 356 10*3/uL (ref 150–400)
RBC: 3.54 MIL/uL — ABNORMAL LOW (ref 3.87–5.11)
RDW: 11.5 % (ref 11.5–15.5)
WBC: 6 10*3/uL (ref 4.0–10.5)
nRBC: 0 % (ref 0.0–0.2)

## 2019-02-07 LAB — COMPREHENSIVE METABOLIC PANEL
ALT: 22 U/L (ref 0–44)
AST: 23 U/L (ref 15–41)
Albumin: 4.4 g/dL (ref 3.5–5.0)
Alkaline Phosphatase: 105 U/L (ref 38–126)
Anion gap: 9 (ref 5–15)
BUN: 11 mg/dL (ref 6–20)
CO2: 23 mmol/L (ref 22–32)
Calcium: 9.6 mg/dL (ref 8.9–10.3)
Chloride: 105 mmol/L (ref 98–111)
Creatinine, Ser: 0.6 mg/dL (ref 0.44–1.00)
GFR calc Af Amer: >60 mL/min (ref >60)
GFR calc non Af Amer: >60 mL/min (ref >60)
Glucose, Bld: 110 mg/dL — ABNORMAL HIGH (ref 70–99)
Potassium: 3.5 mmol/L (ref 3.5–5.1)
Sodium: 137 mmol/L (ref 135–145)
Total Bilirubin: 0.4 mg/dL (ref 0.3–1.2)
Total Protein: 8.7 g/dL — ABNORMAL HIGH (ref 6.5–8.1)

## 2019-02-07 LAB — URINALYSIS, ROUTINE W REFLEX MICROSCOPIC
Bilirubin Urine: NEGATIVE
Glucose, UA: NEGATIVE mg/dL
Hgb urine dipstick: NEGATIVE
Ketones, ur: NEGATIVE mg/dL
Leukocytes,Ua: NEGATIVE
Nitrite: NEGATIVE
Protein, ur: NEGATIVE mg/dL
Specific Gravity, Urine: 1.009 (ref 1.005–1.030)
pH: 8 (ref 5.0–8.0)

## 2019-02-07 LAB — I-STAT BETA HCG BLOOD, ED (MC, WL, AP ONLY): I-stat hCG, quantitative: <5 m[IU]/mL (ref <5)

## 2019-02-07 LAB — LIPASE, BLOOD: Lipase: 38 U/L (ref 11–51)

## 2019-02-07 MED ORDER — SODIUM CHLORIDE 0.9% FLUSH: 3.0000 mL | Freq: Once | INTRAVENOUS | Status: DC

## 2019-02-07 NOTE — ED Triage Notes (Signed)
Patient here via EMS with complaints of nausea and lower abd pain. Recently diagnosed with uterine fibroids.

## 2019-02-07 NOTE — ED Provider Notes (Signed)
[facility] DEPT Provider Note   CSN: [id number] Arrival date & time: 02/07/19  0441   History   Chief Complaint Chief Complaint  Patient presents with   Nausea   Abdominal Pain   HPI Emma Harris is a 53 y.o. female with past medical history significant for cholecystectomy, uterine fibroids who presents for evaluation of lower abdominal pain.  Patient states she has had intermittent lower abdominal pain x1 week.  Was seen 3 days ago at [facility] and had CT scan which showed multiple uterine fibroids.  She had also been seen previously for abdominal pain and was given tramadol and Zofran.  Patient states her tramadol and Zofran makes her drowsy and the tramadol makes her more nauseous.  She gave her tramadol and Zofran to the providers at [facility] and instructed them to throw away this medicine.  She has been intermittently taking meloxicam for her pain.  Patient states her pain mainly occurs at night.  She has no current pain or nausea.  She is tolerating p.o. intake at home without difficulty.  Denies fever, chills, emesis, headache, neck pain, neck stiffness, sore throat, chest pain, shortness of breath, dysuria, constipation, diarrhea, vaginal discharge, concerns for STDs.  She is not followed by an OB/GYN.  Denies additional aggravating or alleviating factors.  History btained from patient and past medical records.  No interpreter was used.     HPI  History reviewed. No pertinent past medical history.  There are no active problems to display for this patient.   Past Surgical History:  Procedure Laterality Date   CHOLECYSTECTOMY  2006   FRACTURE SURGERY Left    Knee     OB History   No obstetric history on file.      Home Medications    Prior to Admission medications   Medication Sig Start Date End Date Taking? Authorizing Provider  acidophilus (RISAQUAD) CAPS capsule Take 1 capsule by mouth daily.    Yes [provider]  amLODipine (NORVASC) 5 MG tablet Take 5 mg by mouth daily. 05/28/18  Yes [provider]  ibuprofen (ADVIL) 200 MG tablet Take 200 mg by mouth every 6 (six) hours as needed for fever, headache or moderate pain.   Yes [provider]  meloxicam (MOBIC) 15 MG tablet Take 15 mg by mouth daily as needed for muscle spasms. 10/10/18  Yes [provider]  omeprazole (PRILOSEC) 40 MG capsule Take 40 mg by mouth daily as needed. Acid reflux 02/03/19  Yes [provider]  Prenatal Vit-Fe Fumarate-FA (PRENATAL MULTIVITAMIN) TABS tablet Take 1 tablet by mouth daily at 12 noon.   Yes [provider]    Family History Family History  Problem Relation Age of Onset   Hypertension Mother     Social History Social History   Tobacco Use   Smoking status: Never Smoker   Smokeless tobacco: Never Used  Substance Use Topics   Alcohol use: Yes    Comment: rare   Drug use: No     Allergies   Shellfish allergy and Codeine   Review of Systems Review of Systems  Constitutional: Negative.   HENT: Negative.   Respiratory: Negative.   Cardiovascular: Negative.   Gastrointestinal: Positive for abdominal pain and nausea. Negative for anal bleeding, blood in stool, constipation, diarrhea, rectal pain and vomiting.  Genitourinary: Negative.   Musculoskeletal: Negative.   Skin: Negative.   Neurological: Negative.   All other systems reviewed and  are negative.    Physical Exam Updated Vital Signs BP (!) 154/93 (BP Location: Left Arm)    Pulse (!) 104    Temp 98.4 F (36.9 C) (Oral)    Resp 16    Ht 5' (1.524 m)    Wt 72.6 kg    SpO2 98%    BMI 31.25 kg/m   Physical Exam Vitals signs and nursing note reviewed.  Constitutional:      General: She is not in acute distress.    Appearance: She is well-developed. She is not ill-appearing, toxic-appearing or diaphoretic.  HENT:     Head: Normocephalic and atraumatic.      Mouth/Throat:     Mouth: Mucous membranes are moist.     Pharynx: Oropharynx is clear.  Eyes:     Pupils: Pupils are equal, round, and reactive to light.  Neck:     Musculoskeletal: Normal range of motion.  Cardiovascular:     Rate and Rhythm: Normal rate.     Heart sounds: Normal heart sounds.  Pulmonary:     Effort: Pulmonary effort is normal. No respiratory distress.     Breath sounds: Normal breath sounds.  Abdominal:     General: There is no distension.     Comments: Soft, nontender without rebound or guarding.  Negative Murphy sign.  No evidence of abdominal wall herniations or overlying skin changes.  Genitourinary:    Comments: Refused GU exam. Musculoskeletal: Normal range of motion.     Comments: Moves all 4 extremities without difficulty.  Skin:    General: Skin is warm and dry.     Comments: No edema, erythema, ecchymosis or warmth.  Brisk capillary refill.  Neurological:     Mental Status: She is alert.     Comments: No facial droop.  Cranial nerves II through XII grossly intact.  Ambulatory that difficulty.      ED Treatments / Results  Labs (all labs ordered are listed, but only abnormal results are displayed) Labs Reviewed  COMPREHENSIVE METABOLIC PANEL - Abnormal; Notable for the following components:      Result Value   Glucose, Bld 110 (*)    Total Protein 8.7 (*)    All other components within normal limits  CBC - Abnormal; Notable for the following components:   RBC 3.54 (*)    MCV 104.2 (*)    MCH 34.7 (*)    All other components within normal limits  URINALYSIS, ROUTINE W REFLEX MICROSCOPIC - Abnormal; Notable for the following components:   Color, Urine STRAW (*)    Bacteria, UA RARE (*)    All other components within normal limits  LIPASE, BLOOD  I-STAT BETA HCG BLOOD, ED ([facility], [facility], AP ONLY)    EKG None  Radiology US Transvaginal Non-ob  Result Date: 02/07/2019 CLINICAL DATA:  Pelvic pain for 1 week, history of fibroids EXAM:  TRANSABDOMINAL AND TRANSVAGINAL ULTRASOUND OF PELVIS DOPPLER ULTRASOUND OF OVARIES TECHNIQUE: Both transabdominal and transvaginal ultrasound examinations of the pelvis were performed. Transabdominal technique was performed for global imaging of the pelvis including uterus, ovaries, adnexal regions, and pelvic cul-de-sac. It was necessary to proceed with endovaginal exam following the transabdominal exam to visualize the ovaries. Color and duplex Doppler ultrasound was utilized to evaluate blood flow to the ovaries. COMPARISON:  05/29/2018 FINDINGS: Uterus Measurements: 6.5 x 2.9 x 5.2 cm. = volume: 51 mL. Multiple uterine fibroids are identified. The largest of these measures 3.7 cm in greatest dimension. These are similar in appearance  to that noted on prior CT examination. Endometrium Thickness: 5 mm.  Mild fluid is noted within the endometrial cavity. Right ovary Measurements: 1.1 x 0.7 x 0.8 cm. = volume: 0.3 mL. Normal appearance/no adnexal mass. Left ovary Measurements: 1.5 x 0.8 x 1.8 cm = volume: 1.1 mL. Normal appearance/no adnexal mass. Pulsed Doppler evaluation of both ovaries demonstrates normal low-resistance arterial and venous waveforms. Other findings No abnormal free fluid. IMPRESSION: Multiple uterine fibroids. Small amount of fluidIs noted within the endometrial canal. No other focal abnormality is noted. Electronically Signed   By: Inez Catalina M.D.   On: 02/07/2019 09:16   US Pelvis Complete  Result Date: 02/07/2019 CLINICAL DATA:  Pelvic pain for 1 week, history of fibroids EXAM: TRANSABDOMINAL AND TRANSVAGINAL ULTRASOUND OF PELVIS DOPPLER ULTRASOUND OF OVARIES TECHNIQUE: Both transabdominal and transvaginal ultrasound examinations of the pelvis were performed. Transabdominal technique was performed for global imaging of the pelvis including uterus, ovaries, adnexal regions, and pelvic cul-de-sac. It was necessary to proceed with endovaginal exam following the transabdominal exam to  visualize the ovaries. Color and duplex Doppler ultrasound was utilized to evaluate blood flow to the ovaries. COMPARISON:  05/29/2018 FINDINGS: Uterus Measurements: 6.5 x 2.9 x 5.2 cm. = volume: 51 mL. Multiple uterine fibroids are identified. The largest of these measures 3.7 cm in greatest dimension. These are similar in appearance to that noted on prior CT examination. Endometrium Thickness: 5 mm.  Mild fluid is noted within the endometrial cavity. Right ovary Measurements: 1.1 x 0.7 x 0.8 cm. = volume: 0.3 mL. Normal appearance/no adnexal mass. Left ovary Measurements: 1.5 x 0.8 x 1.8 cm = volume: 1.1 mL. Normal appearance/no adnexal mass. Pulsed Doppler evaluation of both ovaries demonstrates normal low-resistance arterial and venous waveforms. Other findings No abnormal free fluid. IMPRESSION: Multiple uterine fibroids. Small amount of fluidIs noted within the endometrial canal. No other focal abnormality is noted. Electronically Signed   By: Inez Catalina M.D.   On: 02/07/2019 09:16   [place] Art/ven Flow Abd Pelv Doppler  Result Date: 02/07/2019 CLINICAL DATA:  Pelvic pain for 1 week, history of fibroids EXAM: TRANSABDOMINAL AND TRANSVAGINAL ULTRASOUND OF PELVIS DOPPLER ULTRASOUND OF OVARIES TECHNIQUE: Both transabdominal and transvaginal ultrasound examinations of the pelvis were performed. Transabdominal technique was performed for global imaging of the pelvis including uterus, ovaries, adnexal regions, and pelvic cul-de-sac. It was necessary to proceed with endovaginal exam following the transabdominal exam to visualize the ovaries. Color and duplex Doppler ultrasound was utilized to evaluate blood flow to the ovaries. COMPARISON:  05/29/2018 FINDINGS: Uterus Measurements: 6.5 x 2.9 x 5.2 cm. = volume: 51 mL. Multiple uterine fibroids are identified. The largest of these measures 3.7 cm in greatest dimension. These are similar in appearance to that noted on prior CT examination. Endometrium Thickness: 5  mm.  Mild fluid is noted within the endometrial cavity. Right ovary Measurements: 1.1 x 0.7 x 0.8 cm. = volume: 0.3 mL. Normal appearance/no adnexal mass. Left ovary Measurements: 1.5 x 0.8 x 1.8 cm = volume: 1.1 mL. Normal appearance/no adnexal mass. Pulsed Doppler evaluation of both ovaries demonstrates normal low-resistance arterial and venous waveforms. Other findings No abnormal free fluid. IMPRESSION: Multiple uterine fibroids. Small amount of fluidIs noted within the endometrial canal. No other focal abnormality is noted. Electronically Signed   By: Inez Catalina M.D.   On: 02/07/2019 09:16    Procedures Procedures (including critical care time)  CT AP w contrast 02/03/2019  Uterine fibroids.  No appendicitis.  No  additional AP pathology.  Medications Ordered in ED Medications  sodium chloride flush (NS) 0.9 % injection 3 mL (has no administration in time range)   Initial Impression / Assessment and Plan / ED Course  I have reviewed the triage vital signs and the nursing notes.  Pertinent labs & imaging results that were available during my care of the patient were reviewed by me and considered in my medical decision making (see chart for details).  53 year old female appears otherwise well presents for evaluation of intermittent abdominal pain and nausea.  She is afebrile, nonseptic, non-ill-appearing.  She has no current symptoms.  She is tolerating p.o. intake at home.  Seen at [facility] 4 days ago for similar symptoms.  Personally reviewed her CT scan which showed uterine fibroids.  No additional AP pathology.  Her abdomen is soft, nontender without rebound or guarding. No surgical Abdomen. Heart and lungs clear.  Patient refused GU exam states she has no vaginal discharge, concerns for STDs.  Discussed with patient cannot rule out PID without pelvic exam however she declined.  Will obtain ultrasound to rule out pelvic pathology.  Labs obtained from triage.  Labs personally  reviewed: Urinalysis negative for infection Pregnancy negative Lipase 38 CBC without leukocytosis, hemoglobin 19.6 Metabolic panel with mildly elevated glucose at 110, no additional electrolyte, renal or liver abnormality [place] with uterine fibroids  0935: Reevaluation abdomen soft, nontender to rebound or guarding.  Continues to deny any pain or nausea currently.  Do not feel patient needs repeat CT imaging as she had one approximately 3 days ago.  Discussed with patient Tylenol and ibuprofen for pain.  States she does not want additional pain medicine or antinausea medicine for at home.  Discussed follow-up with OB/GYN for recurrent pain.  Patient is nontoxic, nonseptic appearing, in no apparent distress.  Patient's pain and other symptoms adequately managed in emergency department.  Fluid bolus given.  Labs, imaging and vitals reviewed.  Patient does not meet the SIRS or Sepsis criteria.  On repeat exam patient does not have a surgical abdomin and there are no peritoneal signs.  No indication of appendicitis, bowel obstruction, bowel perforation, cholecystitis, diverticulitis, PID or ectopic pregnancy.  Patient discharged home with symptomatic treatment and given strict instructions for follow-up with their primary care physician.  I have also discussed reasons to return immediately to the ER.  Patient expresses understanding and agrees with plan.     Final Clinical Impressions(s) / ED Diagnoses   Final diagnoses:  Lower abdominal pain  Fibroids    ED Discharge Orders    None       Leyland Kenna A, PA-C 02/07/19 [place], MD 02/11/19 [phone]

## 2019-02-07 NOTE — Discharge Instructions (Signed)
Tylenol and ibuprofen as needed for pain.  Follow-up with OB/GYN for reevaluation.  Return to the ED for any new worsening symptoms.

## 2019-02-07 NOTE — ED Notes (Signed)
Urine culture sent to the lab. 

## 2019-02-11 ENCOUNTER — Telehealth: Payer: Self-pay | Admitting: Emergency Medicine

## 2019-02-11 NOTE — Telephone Encounter (Signed)
Pt called and left a message on the nurse voicemail line stating that she has been trying to call and schedule an appointment.

## 2019-03-04 ENCOUNTER — Telehealth: Payer: Self-pay | Admitting: Obstetrics and Gynecology

## 2019-03-04 NOTE — Telephone Encounter (Signed)
Attempted to contact patient about her appointment on 9/9 @ 2:35. No answer, left voicemail instructing patient to wear a face mask for the entire appointment and no visitors are allowed. Patient instructed not to attend her appointment if she has any symptoms. Symptom list and office number left.

## 2019-03-05 ENCOUNTER — Ambulatory Visit (INDEPENDENT_AMBULATORY_CARE_PROVIDER_SITE_OTHER): Payer: BLUE CROSS/BLUE SHIELD | Admitting: Obstetrics and Gynecology

## 2019-03-05 ENCOUNTER — Encounter: Payer: Self-pay | Admitting: Obstetrics and Gynecology

## 2019-03-05 ENCOUNTER — Other Ambulatory Visit: Payer: Self-pay

## 2019-03-05 VITALS — BP 123/71 | HR 63 | Ht 63.0 in | Wt 155.8 lb

## 2019-03-05 DIAGNOSIS — Z113 Encounter for screening for infections with a predominantly sexual mode of transmission: Secondary | ICD-10-CM

## 2019-03-05 DIAGNOSIS — E038 Other specified hypothyroidism: Secondary | ICD-10-CM | POA: Insufficient documentation

## 2019-03-05 DIAGNOSIS — R350 Frequency of micturition: Secondary | ICD-10-CM

## 2019-03-05 DIAGNOSIS — Z1151 Encounter for screening for human papillomavirus (HPV): Secondary | ICD-10-CM

## 2019-03-05 DIAGNOSIS — E039 Hypothyroidism, unspecified: Secondary | ICD-10-CM | POA: Diagnosis not present

## 2019-03-05 DIAGNOSIS — R1030 Lower abdominal pain, unspecified: Secondary | ICD-10-CM

## 2019-03-05 DIAGNOSIS — Z124 Encounter for screening for malignant neoplasm of cervix: Secondary | ICD-10-CM

## 2019-03-05 MED ORDER — KETOROLAC TROMETHAMINE 10 MG PO TABS: 10.0000 mg | ORAL_TABLET | Freq: Four times a day (QID) | ORAL | 0 refills | Status: DC | PRN

## 2019-03-05 NOTE — Progress Notes (Signed)
Obstetrics and Gynecology New Patient Evaluation  Appointment Date: 03/05/2019  OBGYN Clinic: [facility]  Primary Care Provider: Medicine, [facility]  Referring Provider: Medicine, [facility] [place]*  Chief Complaint: ED follow up  History of Present Illness: Syliva Rosson is a 53 y.o. African-American [id number] (No LMP recorded. Patient is perimenopausal.), seen for the above chief complaint. Her past medical history is significant for HTN, subclinical hypothyroidism   Patient initially seen in the ED on 8/10 at [facility] and had a CT scan that showed fibroids and then re-presented to our ED on 8/14 with similar complaints and u/s done.  Patient states she pain came on weeks before her initial ED visit. She states the pain is sharp, cramping, in the RLQ, lasts for a few seconds, and no nausea, vomiting, vb, spotting, blood in bm, dysuria s/s.  She endorse a history of increased urinary frequency.   No pain today   Review of Systems:  as noted in the History of Present Illness.  Patient Active Problem List   Diagnosis Date Noted  . Lower abdominal pain 03/05/2019  . Subclinical hypothyroidism 03/05/2019    Past Medical History:  Past Medical History:  Diagnosis Date  . Hypertension     Past Surgical History:  Past Surgical History:  Procedure Laterality Date  . CHOLECYSTECTOMY  2006  . FRACTURE SURGERY Left    Knee  . umbilical surgery     as a child. patient unsure of procedure    Past Obstetrical History:  OB History  Gravida Para Term Preterm AB Living  0 0 0 0 0 0  SAB TAB Ectopic Multiple Live Births  0 0 0 0 0    Past Gynecological History: As per HPI. History of Pap Smear(s): Yes.   Last pap unsure, which was negative History of HRT use: No.   Social History:  Social History   Socioeconomic History  . Marital status: Single    Spouse name: Not on file  . Number of children: Not on file  . Years of  education: Not on file  . Highest education level: Not on file  Occupational History  . Not on file  Social Needs  . Financial resource strain: Not on file  . Food insecurity    Worry: Not on file    Inability: Not on file  . Transportation needs    Medical: Not on file    Non-medical: Not on file  Tobacco Use  . Smoking status: Never Smoker  . Smokeless tobacco: Never Used  Substance and Sexual Activity  . Alcohol use: Yes    Comment: rare  . Drug use: No  . Sexual activity: Not on file  Lifestyle  . Physical activity    Days per week: Not on file    Minutes per session: Not on file  . Stress: Not on file  Relationships  . Social Herbalist on phone: Not on file    Gets together: Not on file    Attends religious service: Not on file    Active member of club or organization: Not on file    Attends meetings of clubs or organizations: Not on file    Relationship status: Not on file  . Intimate partner violence    Fear of current or ex partner: Not on file    Emotionally abused: Not on file    Physically abused: Not on file    Forced sexual activity: Not on  file  Other Topics Concern  . Not on file  Social History Narrative  . Not on file    Family History:  Family History  Problem Relation Age of Onset  . Hypertension Mother    Health Maintenance:  Mammogram(s): Yes.   Up to date per patient Colonoscopy: Yes.   Up to date per patient  Medications Harvin Hazel had no medications administered during this visit. Current Outpatient Medications  Medication Sig Dispense Refill  . amLODipine (NORVASC) 5 MG tablet Take 5 mg by mouth daily.  0  . ibuprofen (ADVIL) 200 MG tablet Take 200 mg by mouth every 6 (six) hours as needed for fever, headache or moderate pain.    . meloxicam (MOBIC) 15 MG tablet Take 15 mg by mouth daily as needed for muscle spasms.    Marland Kitchen ketorolac (TORADOL) 10 MG tablet Take 1 tablet (10 mg total) by mouth every 6 (six) hours as  needed. 20 tablet 0   No current facility-administered medications for this visit.     Allergies Shellfish allergy and Codeine   Physical Exam:  BP 123/71   Pulse 63   Ht 5\' 3"  (1.6 m)   Wt 155 lb 12.8 oz (70.7 kg)   BMI 27.60 kg/m  Body mass index is 27.6 kg/m. General appearance: Well nourished, well developed female in no acute distress.  Neck:  Supple, normal appearance, and no thyromegaly  Cardiovascular: normal s1 and s2.  No murmurs, rubs or gallops. Respiratory:  Clear to auscultation bilateral. Normal respiratory effort Abdomen: positive bowel sounds and no masses, hernias; diffusely non tender to palpation, non distended. Umbilical incision c/d/i Neuro/Psych:  Normal mood and affect.  Skin:  Warm and dry.  Lymphatic:  No inguinal lymphadenopathy.   Pelvic exam: is not limited by body habitus EGBUS: within normal limits, moderate atrophy Vagina: within normal limits and with no blood or discharge in the vault, Cervix: normal appearing cervix without tenderness, discharge or lesions. Uterus:  nonenlarged and non tender and Adnexa:  normal adnexa and no mass, fullness, tenderness Rectovaginal: deferred  Laboratory: reviewed  Radiology: reviewed  Assessment: pt stable  Plan:  1. Lower abdominal pain D/w her that uterus is overall small and usually any pain or discomfort from fibroids that are undergoing atrophy is closer to menopause but possible could be etiology. Pt told if s/s come back to try toradol I sent in and if that helps likely it is related to fibroid and strongly consider hysterectomy. If pain comes back and toradol doesn't help, pt told to still let us know. Pap updated  RTC PRN  Durene Romans MD Attending [facility])

## 2019-03-06 LAB — CYTOLOGY - PAP
Chlamydia: NEGATIVE
Diagnosis: NEGATIVE
HPV: NOT DETECTED
Neisseria Gonorrhea: NEGATIVE
Trichomonas: NEGATIVE

## 2019-03-21 DIAGNOSIS — R1013 Epigastric pain: Secondary | ICD-10-CM | POA: Diagnosis not present

## 2019-04-16 DIAGNOSIS — I10 Essential (primary) hypertension: Secondary | ICD-10-CM | POA: Diagnosis not present

## 2019-04-16 DIAGNOSIS — R110 Nausea: Secondary | ICD-10-CM | POA: Diagnosis not present

## 2019-04-21 DIAGNOSIS — R634 Abnormal weight loss: Secondary | ICD-10-CM | POA: Diagnosis not present

## 2019-04-22 ENCOUNTER — Encounter (HOSPITAL_COMMUNITY): Payer: Self-pay | Admitting: Emergency Medicine

## 2019-04-22 ENCOUNTER — Other Ambulatory Visit: Payer: Self-pay

## 2019-04-22 ENCOUNTER — Emergency Department (HOSPITAL_COMMUNITY)
Admission: EM | Admit: 2019-04-22 | Discharge: 2019-04-22 | Disposition: A | Discharge status: Home / Self Care | Payer: BLUE CROSS/BLUE SHIELD | Attending: Emergency Medicine | Admitting: Emergency Medicine

## 2019-04-22 DIAGNOSIS — Z79899 Other long term (current) drug therapy: Secondary | ICD-10-CM | POA: Diagnosis not present

## 2019-04-22 DIAGNOSIS — I10 Essential (primary) hypertension: Secondary | ICD-10-CM | POA: Diagnosis not present

## 2019-04-22 DIAGNOSIS — R110 Nausea: Secondary | ICD-10-CM | POA: Insufficient documentation

## 2019-04-22 DIAGNOSIS — R0789 Other chest pain: Secondary | ICD-10-CM | POA: Diagnosis not present

## 2019-04-22 DIAGNOSIS — Z209 Contact with and (suspected) exposure to unspecified communicable disease: Secondary | ICD-10-CM | POA: Diagnosis not present

## 2019-04-22 DIAGNOSIS — R197 Diarrhea, unspecified: Secondary | ICD-10-CM | POA: Diagnosis not present

## 2019-04-22 DIAGNOSIS — E02 Subclinical iodine-deficiency hypothyroidism: Secondary | ICD-10-CM | POA: Insufficient documentation

## 2019-04-22 LAB — CBC
HCT: 39.9 % (ref 36.0–46.0)
Hemoglobin: 13.1 g/dL (ref 12.0–15.0)
MCH: 34.7 pg — ABNORMAL HIGH (ref 26.0–34.0)
MCHC: 32.8 g/dL (ref 30.0–36.0)
MCV: 105.6 fL — ABNORMAL HIGH (ref 80.0–100.0)
Platelets: 322 10*3/uL (ref 150–400)
RBC: 3.78 MIL/uL — ABNORMAL LOW (ref 3.87–5.11)
RDW: 11.7 % (ref 11.5–15.5)
WBC: 6 10*3/uL (ref 4.0–10.5)
nRBC: 0 % (ref 0.0–0.2)

## 2019-04-22 LAB — URINALYSIS, ROUTINE W REFLEX MICROSCOPIC
Bilirubin Urine: NEGATIVE
Glucose, UA: NEGATIVE mg/dL
Hgb urine dipstick: NEGATIVE
Ketones, ur: NEGATIVE mg/dL
Nitrite: NEGATIVE
Protein, ur: NEGATIVE mg/dL
Specific Gravity, Urine: 1.011 (ref 1.005–1.030)
pH: 7 (ref 5.0–8.0)

## 2019-04-22 LAB — COMPREHENSIVE METABOLIC PANEL
ALT: 55 U/L — ABNORMAL HIGH (ref 0–44)
AST: 34 U/L (ref 15–41)
Albumin: 4.6 g/dL (ref 3.5–5.0)
Alkaline Phosphatase: 119 U/L (ref 38–126)
Anion gap: 9 (ref 5–15)
BUN: 10 mg/dL (ref 6–20)
CO2: 25 mmol/L (ref 22–32)
Calcium: 9.9 mg/dL (ref 8.9–10.3)
Chloride: 105 mmol/L (ref 98–111)
Creatinine, Ser: 0.59 mg/dL (ref 0.44–1.00)
GFR calc Af Amer: >60 mL/min (ref >60)
GFR calc non Af Amer: >60 mL/min (ref >60)
Glucose, Bld: 92 mg/dL (ref 70–99)
Potassium: 4.1 mmol/L (ref 3.5–5.1)
Sodium: 139 mmol/L (ref 135–145)
Total Bilirubin: 0.2 mg/dL — ABNORMAL LOW (ref 0.3–1.2)
Total Protein: 9.1 g/dL — ABNORMAL HIGH (ref 6.5–8.1)

## 2019-04-22 LAB — HCG, QUANTITATIVE, PREGNANCY: hCG, Beta Chain, Quant, S: 1 m[IU]/mL (ref <5)

## 2019-04-22 LAB — LIPASE, BLOOD: Lipase: 40 U/L (ref 11–51)

## 2019-04-22 LAB — TSH: TSH: 2.673 u[IU]/mL (ref 0.350–4.500)

## 2019-04-22 MED ORDER — PROMETHAZINE HCL 25 MG RE SUPP: 25.0000 mg | Freq: Four times a day (QID) | RECTAL | 1 refills | Status: DC | PRN

## 2019-04-22 NOTE — ED Provider Notes (Signed)
[facility] DEPT Provider Note   CSN: [id number] Arrival date & time: 04/22/19  [id number]  History    Chief Complaint  Patient presents with  . Diarrhea  . Nausea    HPI Emma Harris is a 53 y.o. female with PMHx s/f Chronic nausea since March, status post cholecystectomy in 2006, hypertension, who presents to the ED with nausea.  Patient has had recent visits for nausea.  She reports that her nausea is currently occurring at 3 times a week.  Most the time it occurs in the late morning or very early in the mornings.  It is not associated with eating.  Patient does take omeprazole daily as directed by her gastroenterologist.  Patient does report 2 small episodes of diarrhea this morning.  She reports that she is typically constipated.  Her stools are usually pale brown.  The nausea is located specifically in her epigastrium.  Patient takes meloxicam once a month for muscle spasms.  Her most recent colonoscopy in 2019 was normal.  Patient also reports weight loss of 14 pounds over the last 6 months.  She reports that she does have an appetite but thinks maybe she has been eating less due to the nausea.  Patient has been avoiding greasy, creamy, fried foods that have bothered her stomach more more in the last couple of months.  Patient does have a family history of colon cancer.  Sister was diagnosed at 48 years old.  Patient denies any history of familial colon cancer and denies any other further family history of colon cancer, IBS, Crohn's.  Allergies: Shellfish allergy and Codeine Medications:  Current Outpatient Medications  Medication Instructions  . amLODipine (NORVASC) 5 mg, Oral, Daily  . ibuprofen (ADVIL) 200 mg, Oral, Every 6 hours PRN  . ketorolac (TORADOL) 10 mg, Oral, Every 6 hours PRN  . meloxicam (MOBIC) 15 mg, Oral, Daily PRN  . omeprazole (PRILOSEC) 40 mg, Oral, Daily  . promethazine (PHENERGAN) 12.5 mg, Oral, 3 times daily PRN  . promethazine  (PHENERGAN) 25 mg, Rectal, Every 6 hours PRN, Use in place of oral medication if you are vomiting   Past Medical/Surgical History Past Medical History:  Diagnosis Date  . Hypertension     Patient Active Problem List   Diagnosis Date Noted  . Lower abdominal pain 03/05/2019  . Subclinical hypothyroidism 03/05/2019    Past Surgical History:  Procedure Laterality Date  . CHOLECYSTECTOMY  2006  . FRACTURE SURGERY Left    Knee  . umbilical surgery     as a child. patient unsure of procedure    OB History  Gravida Para Term Preterm AB Living  0 0 0 0 0 0  SAB TAB Ectopic Multiple Live Births  0 0 0 0 0   Family History  Problem Relation Age of Onset  . Hypertension Mother    Social History   Tobacco Use  . Smoking status: Never Smoker  . Smokeless tobacco: Never Used  Substance Use Topics  . Alcohol use: Yes    Comment: rare  . Drug use: No   Review of Systems Review of Systems  Constitutional: Positive for unexpected weight change. Negative for activity change, appetite change, chills and fever.  HENT: Negative for ear pain and sore throat.   Eyes: Negative for pain and visual disturbance.  Respiratory: Positive for chest tightness. Negative for cough and shortness of breath.   Cardiovascular: Negative for chest pain and palpitations.  Gastrointestinal: Positive for diarrhea and nausea.  Negative for abdominal pain, rectal pain and vomiting.  Genitourinary: Negative for dysuria and hematuria.  Musculoskeletal: Negative for arthralgias and back pain.  Skin: Negative for color change and rash.  Neurological: Negative for seizures and syncope.  All other systems reviewed and are negative.   Physical Exam Updated Vital Signs BP 112/72 (BP Location: Left Arm)   Pulse 68   Temp 98.3 F (36.8 C) (Oral)   Resp 18   SpO2 100%   Physical Exam Vitals signs and nursing note reviewed.  Constitutional:      General: She is not in acute distress.    Appearance: She is  well-developed.  HENT:     Head: Normocephalic and atraumatic.  Eyes:     Conjunctiva/sclera: Conjunctivae normal.  Neck:     Musculoskeletal: Neck supple.  Cardiovascular:     Rate and Rhythm: Normal rate and regular rhythm.     Heart sounds: No murmur.  Pulmonary:     Effort: Pulmonary effort is normal. No respiratory distress.     Breath sounds: Normal breath sounds.  Abdominal:     General: Bowel sounds are normal.     Palpations: Abdomen is soft.     Tenderness: There is no abdominal tenderness.  Skin:    General: Skin is warm and dry.  Neurological:     Mental Status: She is alert.     ED Treatments / Results  Labs (all labs ordered are listed, but only abnormal results are displayed) Labs Reviewed  COMPREHENSIVE METABOLIC PANEL - Abnormal; Notable for the following components:      Result Value   Total Protein 9.1 (*)    ALT 55 (*)    Total Bilirubin 0.2 (*)    All other components within normal limits  CBC - Abnormal; Notable for the following components:   RBC 3.78 (*)    MCV 105.6 (*)    MCH 34.7 (*)    All other components within normal limits  URINALYSIS, ROUTINE W REFLEX MICROSCOPIC - Abnormal; Notable for the following components:   APPearance HAZY (*)    Leukocytes,Ua LARGE (*)    Bacteria, UA MANY (*)    All other components within normal limits  LIPASE, BLOOD  TSH  HCG, QUANTITATIVE, PREGNANCY   EKG None  Radiology No results found.  Procedures Procedures (including critical care time)  Medications Ordered in ED Medications - No data to display  Initial Impression / Assessment and Plan / ED Course  I have reviewed the triage vital signs and the nursing notes. Pertinent labs & imaging results that were available during my care of the patient were reviewed by me and considered in my medical decision making (see chart for details). Patient presenting to the ED with nausea that is now improved.  She reports that the nausea started around  2:00 this morning and is now gone away after sitting in the ED for little bit.  Set expectations with patient as we would not be able to do a through work-up as she is not in any acute condition at this time. I have encouraged her to follow up with her gastroenterologist who is currently working up nausea.  I am not overly concerned about the diarrhea as it only happened 2 times this morning and patient reports small volume.  Patient will need p.o. hydration which she is currently tolerating and watchfully waiting.  1:51 PM Went to check on patient and she continues to be stable.  She denies any nausea  at this time.  She is agreeable with discharge home.  We will send her home with rectal Phenergan just in case she is unable to tolerate p.o.  We have encouraged her to follow-up with her PCP regarding the weight loss in the persistent nausea.  Additionally, continue to follow-up with her gastroenterologist.  Also spoke to her about taking Phenergan before bedtime as her symptoms in the middle of the night were very early in the morning.    Final Clinical Impressions(s) / ED Diagnoses   Final diagnoses:  Nausea   ED Discharge Orders         Ordered    promethazine (PHENERGAN) 25 MG suppository  Every 6 hours PRN     04/22/19 1350         Disposition: Home  Wilber Oliphant, M.D. FM PGY-2      Wilber Oliphant, MD 04/22/19 1351    Blanchie Dessert, MD 04/22/19 2134

## 2019-04-22 NOTE — Discharge Instructions (Addendum)
Please follow-up with your primary care provider and your gastroenterologist for further work-up. For symptoms, you can try using the Phenergan prior to bedtime or taking it if you wake up from nausea in the middle of the night. Please continue to drink plenty of water, especially if you are continuing to have any diarrhea.  If you have any further concerns that are nonemergent, please follow-up with your primary care provider.  We have also prescribed you Phenergan suppositories which you can use in the event that you are vomiting and cannot take anything by mouth.

## 2019-04-22 NOTE — ED Triage Notes (Signed)
Per [facility] pt from home for n/v/d for month hx colitis. Reports loss of taste and chills since yesterday.

## 2019-04-22 NOTE — ED Triage Notes (Signed)
Pt only c/o is nausea and diarrhea for "while". Was supposed to see doctor today.

## 2019-05-16 DIAGNOSIS — R197 Diarrhea, unspecified: Secondary | ICD-10-CM | POA: Diagnosis not present

## 2019-05-16 DIAGNOSIS — R42 Dizziness and giddiness: Secondary | ICD-10-CM | POA: Diagnosis not present

## 2019-05-16 DIAGNOSIS — R110 Nausea: Secondary | ICD-10-CM | POA: Diagnosis not present

## 2019-05-19 ENCOUNTER — Emergency Department (HOSPITAL_COMMUNITY)
Admission: EM | Admit: 2019-05-19 | Discharge: 2019-05-20 | Disposition: A | Discharge status: Home / Self Care | Payer: BLUE CROSS/BLUE SHIELD | Attending: Emergency Medicine | Admitting: Emergency Medicine

## 2019-05-19 ENCOUNTER — Other Ambulatory Visit: Payer: Self-pay

## 2019-05-19 ENCOUNTER — Encounter (HOSPITAL_COMMUNITY): Payer: Self-pay

## 2019-05-19 DIAGNOSIS — K2900 Acute gastritis without bleeding: Secondary | ICD-10-CM

## 2019-05-19 DIAGNOSIS — Z79899 Other long term (current) drug therapy: Secondary | ICD-10-CM | POA: Diagnosis not present

## 2019-05-19 DIAGNOSIS — I10 Essential (primary) hypertension: Secondary | ICD-10-CM | POA: Diagnosis not present

## 2019-05-19 DIAGNOSIS — R1010 Upper abdominal pain, unspecified: Secondary | ICD-10-CM | POA: Diagnosis not present

## 2019-05-19 LAB — COMPREHENSIVE METABOLIC PANEL
ALT: 18 U/L (ref 0–44)
AST: 23 U/L (ref 15–41)
Albumin: 4.2 g/dL (ref 3.5–5.0)
Alkaline Phosphatase: 99 U/L (ref 38–126)
Anion gap: 10 (ref 5–15)
BUN: 12 mg/dL (ref 6–20)
CO2: 22 mmol/L (ref 22–32)
Calcium: 9.3 mg/dL (ref 8.9–10.3)
Chloride: 110 mmol/L (ref 98–111)
Creatinine, Ser: 0.6 mg/dL (ref 0.44–1.00)
GFR calc Af Amer: >60 mL/min (ref >60)
GFR calc non Af Amer: >60 mL/min (ref >60)
Glucose, Bld: 89 mg/dL (ref 70–99)
Potassium: 3.7 mmol/L (ref 3.5–5.1)
Sodium: 142 mmol/L (ref 135–145)
Total Bilirubin: 0.7 mg/dL (ref 0.3–1.2)
Total Protein: 8.2 g/dL — ABNORMAL HIGH (ref 6.5–8.1)

## 2019-05-19 LAB — LIPASE, BLOOD: Lipase: 38 U/L (ref 11–51)

## 2019-05-19 LAB — CBC
HCT: 36 % (ref 36.0–46.0)
Hemoglobin: 11.5 g/dL — ABNORMAL LOW (ref 12.0–15.0)
MCH: 33.9 pg (ref 26.0–34.0)
MCHC: 31.9 g/dL (ref 30.0–36.0)
MCV: 106.2 fL — ABNORMAL HIGH (ref 80.0–100.0)
Platelets: 328 10*3/uL (ref 150–400)
RBC: 3.39 MIL/uL — ABNORMAL LOW (ref 3.87–5.11)
RDW: 11.7 % (ref 11.5–15.5)
WBC: 6.1 10*3/uL (ref 4.0–10.5)
nRBC: 0 % (ref 0.0–0.2)

## 2019-05-19 MED ORDER — ALUM & MAG HYDROXIDE-SIMETH 200-200-20 MG/5ML PO SUSP
30.0000 mL | Freq: Once | ORAL | Status: AC
  Administered 2019-05-19: 30 mL via ORAL
  Filled 2019-05-19: qty 30

## 2019-05-19 MED ORDER — SODIUM CHLORIDE 0.9% FLUSH: 3.0000 mL | Freq: Once | INTRAVENOUS | Status: DC

## 2019-05-19 MED ORDER — ONDANSETRON 4 MG PO TBDP
4.0000 mg | ORAL_TABLET | Freq: Once | ORAL | Status: AC
  Administered 2019-05-19: 4 mg via ORAL
  Filled 2019-05-19: qty 1

## 2019-05-19 MED ORDER — LIDOCAINE VISCOUS HCL 2 % MT SOLN
15.0000 mL | Freq: Once | OROMUCOSAL | Status: AC
  Administered 2019-05-19: 15 mL via ORAL
  Filled 2019-05-19: qty 15

## 2019-05-19 MED ORDER — PANTOPRAZOLE SODIUM 40 MG PO TBEC
40.0000 mg | DELAYED_RELEASE_TABLET | Freq: Once | ORAL | Status: AC
  Administered 2019-05-19: 40 mg via ORAL
  Filled 2019-05-19: qty 1

## 2019-05-19 NOTE — ED Provider Notes (Signed)
TIME SEEN: 11:09 PM  CHIEF COMPLAINT: Upper abdominal pain  HPI: Patient is a 53 y.o. F with HTN who presents to the emergency department with "nagging" upper abdominal pain x 4 months.  Endoscopy 01/31/19 showed gastritis per her report.  On omeprazole 40 mg once a day.  Hasn't taken since Friday.  Started back today.  Gastric emptying study 03/21/19 that was normal.  Worse lying flat.  Worse after eating.  Not taking NSAIDs.  CTAP 02/03/2019 that showed uterine fibroid but was otherwise normal.  Lost 16 lbs over several months.  Nausea, diarrhea.  No fevers, chills, chest pain, shortness of breath, vomiting, bloody stools, melena, dysuria, hematuria, vaginal bleeding, discharge.  Abd surgeries - cholecystectomy  Appt with GI Dec 10th.  PCP appt in the AM.  EGD 01/31/19 at [facility]:  FINDINGS:  Esophagus: Appeared normal with no lesions. The GE junction and Z-line were at 40 cm.   Stomach: Normal appearing mucosa. No hiatal hernia seen on retroflexion.Biopsies done from the body/antrum/incisura for H.pylori.  Duodenum: The pylorus was patent. The first and second part of the duodenum were examined and appeared normal.   All of patient's care is at [facility].   ROS: See HPI Constitutional: no fever  Eyes: no drainage  ENT: no runny nose   Cardiovascular:  no chest pain  Resp: no SOB  GI: no vomiting GU: no dysuria Integumentary: no rash  Allergy: no hives  Musculoskeletal: no leg swelling  Neurological: no slurred speech ROS otherwise negative  PAST MEDICAL HISTORY/PAST SURGICAL HISTORY:  Past Medical History:  Diagnosis Date  . Hypertension     MEDICATIONS:  Prior to Admission medications   Medication Sig Start Date End Date Taking? Authorizing Provider  amLODipine (NORVASC) 5 MG tablet Take 5 mg by mouth daily. 05/28/18  Yes [provider]  ibuprofen (ADVIL) 200 MG tablet Take 200 mg by mouth every 6 (six) hours as needed for fever, headache or  moderate pain.   Yes [provider]  omeprazole (PRILOSEC) 40 MG capsule Take 40 mg by mouth daily. 04/13/19  Yes [provider]  promethazine (PHENERGAN) 12.5 MG tablet Take 12.5 mg by mouth 3 (three) times daily as needed for nausea/vomiting. 04/16/19  Yes [provider]  promethazine (PHENERGAN) 25 MG suppository Place 1 suppository (25 mg total) rectally every 6 (six) hours as needed for nausea. Use in place of oral medication if you are vomiting 04/22/19 04/21/20 Yes Wilber Oliphant, MD  ketorolac (TORADOL) 10 MG tablet Take 1 tablet (10 mg total) by mouth every 6 (six) hours as needed. Patient not taking: Reported on 04/22/2019 03/05/19   Aletha Halim, MD    ALLERGIES:  Allergies  Allergen Reactions  . Shellfish Allergy Anaphylaxis  . Codeine Other (See Comments)    Pt states that this medication makes her "crazy".     SOCIAL HISTORY:  Social History   Tobacco Use  . Smoking status: Never Smoker  . Smokeless tobacco: Never Used  Substance Use Topics  . Alcohol use: Yes    Comment: rare    FAMILY HISTORY: Family History  Problem Relation Age of Onset  . Hypertension Mother     EXAM: BP (!) 140/92 (BP Location: Left Arm)   Pulse 80   Temp 98.2 F (36.8 C) (Oral)   Resp 17   Ht 5' (1.524 m)   Wt 64.5 kg   SpO2 100%   BMI 27.75 kg/m  CONSTITUTIONAL: Alert and oriented and  responds appropriately to questions. Well-appearing; well-nourished HEAD: Normocephalic EYES: Conjunctivae clear, pupils appear equal, EOM appear intact ENT: normal nose; moist mucous membranes NECK: Supple, normal ROM CARD: RRR; S1 and S2 appreciated; no murmurs, no clicks, no rubs, no gallops RESP: Normal chest excursion without splinting or tachypnea; breath sounds clear and equal bilaterally; no wheezes, no rhonchi, no rales, no hypoxia or respiratory distress, speaking full sentences ABD/GI: Normal bowel sounds; non-distended; soft, tender throughout the upper  abdomen, no rebound, no guarding, no peritoneal signs, no hepatosplenomegaly BACK:  The back appears normal EXT: Normal ROM in all joints; no deformity noted, no edema; no cyanosis SKIN: Normal color for age and race; warm; no rash on exposed skin NEURO: Moves all extremities equally PSYCH: The patient's mood and manner are appropriate.   MEDICAL DECISION MAKING: Patient here with upper abdominal pain.  Suspect this is exacerbation of her gastritis, GERD.  Doubt perforated ulcer.  Underwent endoscopy in August.  Has also recently had a CT of her abdomen pelvis which was unremarkable.  Has also had a negative gastric emptying study.  She is status post cholecystectomy and today has normal LFTs, lipase and no leukocytosis.  No fevers.  No chest pain or shortness of breath.  No bloody stools or melena.  She states that she avoids NSAIDs, alcohol and is very strict with her diet.  She has been off of her omeprazole which may have exacerbated her symptoms.  Will give GI cocktail, Protonix, Zofran here in the ED.  States she has follow-up with her PCP in the morning and an appointment with her gastroenterologist in 2 weeks.  Doubt appendicitis, colitis, diverticulitis, bowel obstruction, ACS, dissection.  ED PROGRESS: Patient reports feeling much better after GI cocktail, Protonix and Zofran.  I feel she is safe to be discharged.  Have advised her to resume her omeprazole 40 mg daily.  Will discharge with prescriptions of Zofran and viscous lidocaine to use as needed.  Recommended over-the-counter Maalox as needed as well.  She will continue to avoid NSAIDs and we have discussed diet changes.  Patient comfortable with plan.  At this time, I do not feel there is any life-threatening condition present. I have reviewed, interpreted and discussed all results (EKG, imaging, lab, urine as appropriate) and exam findings with patient/family. I have reviewed nursing notes and appropriate previous records.  I feel the  patient is safe to be discharged home without further emergent workup and can continue workup as an outpatient as needed. Discussed usual and customary return precautions. Patient/family verbalize understanding and are comfortable with this plan.  Outpatient follow-up has been provided as needed. All questions have been answered.       Emma Harris was evaluated in Emergency Department on 05/19/2019 for the symptoms described in the history of present illness. She was evaluated in the context of the global COVID-19 pandemic, which necessitated consideration that the patient might be at risk for infection with the SARS-CoV-2 virus that causes COVID-19. Institutional protocols and algorithms that pertain to the evaluation of patients at risk for COVID-19 are in a state of rapid change based on information released by regulatory bodies including the CDC and federal and state organizations. These policies and algorithms were followed during the patient's care in the ED.  Patient was seen wearing N95, face shield, gloves.    Jahdiel Krol, Delice Bison, DO 05/20/19 [phone]

## 2019-05-19 NOTE — ED Triage Notes (Signed)
Pt arrived with complaint of generalized abdominal pain over the last few months and nausea. Denies any vomiting or taking any home medication. Reporting some intermittent diarrhea.

## 2019-05-20 DIAGNOSIS — I10 Essential (primary) hypertension: Secondary | ICD-10-CM | POA: Diagnosis not present

## 2019-05-20 DIAGNOSIS — E663 Overweight: Secondary | ICD-10-CM | POA: Diagnosis not present

## 2019-05-20 DIAGNOSIS — R110 Nausea: Secondary | ICD-10-CM | POA: Diagnosis not present

## 2019-05-20 DIAGNOSIS — Z23 Encounter for immunization: Secondary | ICD-10-CM | POA: Diagnosis not present

## 2019-05-20 DIAGNOSIS — K2970 Gastritis, unspecified, without bleeding: Secondary | ICD-10-CM | POA: Diagnosis not present

## 2019-05-20 MED ORDER — ONDANSETRON 4 MG PO TBDP: 4.0000 mg | ORAL_TABLET | Freq: Four times a day (QID) | ORAL | 0 refills | Status: DC | PRN

## 2019-05-20 MED ORDER — LIDOCAINE VISCOUS HCL 2 % MT SOLN: 10.0000 mL | Freq: Four times a day (QID) | OROMUCOSAL | 0 refills | Status: DC | PRN

## 2019-05-20 MED ORDER — OMEPRAZOLE 40 MG PO CPDR: 40.0000 mg | DELAYED_RELEASE_CAPSULE | Freq: Every day | ORAL | 1 refills | Status: DC

## 2019-05-20 NOTE — Discharge Instructions (Addendum)
Please follow-up with your gastroenterologist if symptoms continue.  Please resume your omeprazole 40 mg daily. Please avoid NSAIDs such as aspirin (Goody powders), ibuprofen (Motrin, Advil), naproxen (Aleve) as these may worsen your symptoms.  Tylenol 1000 mg every 6 hours is safe to take as long as you have no history of liver problems (heavy alcohol use, cirrhosis, hepatitis).  Please avoid spicy, acidic (citrus fruits, tomato based sauces, salsa), greasy, fatty foods.  Please avoid caffeine and alcohol.    You may use over-the-counter Maalox, Mylanta or TUMS as needed for discomfort.

## 2019-05-29 DIAGNOSIS — Z20828 Contact with and (suspected) exposure to other viral communicable diseases: Secondary | ICD-10-CM | POA: Diagnosis not present

## 2019-05-29 DIAGNOSIS — Z01812 Encounter for preprocedural laboratory examination: Secondary | ICD-10-CM | POA: Diagnosis not present

## 2019-05-29 DIAGNOSIS — K2900 Acute gastritis without bleeding: Secondary | ICD-10-CM | POA: Diagnosis not present

## 2019-06-05 DIAGNOSIS — K648 Other hemorrhoids: Secondary | ICD-10-CM | POA: Diagnosis not present

## 2019-06-05 DIAGNOSIS — Z1211 Encounter for screening for malignant neoplasm of colon: Secondary | ICD-10-CM | POA: Diagnosis not present

## 2019-06-05 DIAGNOSIS — R634 Abnormal weight loss: Secondary | ICD-10-CM | POA: Diagnosis not present

## 2019-06-05 DIAGNOSIS — D125 Benign neoplasm of sigmoid colon: Secondary | ICD-10-CM | POA: Diagnosis not present

## 2019-06-05 DIAGNOSIS — K635 Polyp of colon: Secondary | ICD-10-CM | POA: Diagnosis not present

## 2019-06-05 DIAGNOSIS — D124 Benign neoplasm of descending colon: Secondary | ICD-10-CM | POA: Diagnosis not present

## 2019-06-05 LAB — HM COLONOSCOPY

## 2019-06-13 DIAGNOSIS — Z0000 Encounter for general adult medical examination without abnormal findings: Secondary | ICD-10-CM | POA: Diagnosis not present

## 2019-10-06 ENCOUNTER — Other Ambulatory Visit: Payer: Self-pay

## 2019-10-06 ENCOUNTER — Emergency Department (HOSPITAL_COMMUNITY)
Admission: EM | Admit: 2019-10-06 | Discharge: 2019-10-07 | Disposition: A | Discharge status: Home / Self Care | Payer: 59 | Attending: Emergency Medicine | Admitting: Emergency Medicine

## 2019-10-06 ENCOUNTER — Emergency Department (HOSPITAL_COMMUNITY): Payer: 59

## 2019-10-06 ENCOUNTER — Encounter (HOSPITAL_COMMUNITY): Payer: Self-pay | Admitting: Emergency Medicine

## 2019-10-06 DIAGNOSIS — Z79899 Other long term (current) drug therapy: Secondary | ICD-10-CM | POA: Diagnosis not present

## 2019-10-06 DIAGNOSIS — R1013 Epigastric pain: Secondary | ICD-10-CM | POA: Diagnosis present

## 2019-10-06 DIAGNOSIS — I10 Essential (primary) hypertension: Secondary | ICD-10-CM | POA: Insufficient documentation

## 2019-10-06 LAB — URINALYSIS, ROUTINE W REFLEX MICROSCOPIC
Bilirubin Urine: NEGATIVE
Glucose, UA: NEGATIVE mg/dL
Hgb urine dipstick: NEGATIVE
Ketones, ur: 20 mg/dL — AB
Nitrite: NEGATIVE
Protein, ur: 30 mg/dL — AB
Specific Gravity, Urine: 1.016 (ref 1.005–1.030)
pH: 9 — ABNORMAL HIGH (ref 5.0–8.0)

## 2019-10-06 LAB — COMPREHENSIVE METABOLIC PANEL
ALT: 29 U/L (ref 0–44)
AST: 31 U/L (ref 15–41)
Albumin: 4.2 g/dL (ref 3.5–5.0)
Alkaline Phosphatase: 88 U/L (ref 38–126)
Anion gap: 9 (ref 5–15)
BUN: 13 mg/dL (ref 6–20)
CO2: 28 mmol/L (ref 22–32)
Calcium: 9.6 mg/dL (ref 8.9–10.3)
Chloride: 103 mmol/L (ref 98–111)
Creatinine, Ser: 0.63 mg/dL (ref 0.44–1.00)
GFR calc Af Amer: >60 mL/min (ref >60)
GFR calc non Af Amer: >60 mL/min (ref >60)
Glucose, Bld: 92 mg/dL (ref 70–99)
Potassium: 3.6 mmol/L (ref 3.5–5.1)
Sodium: 140 mmol/L (ref 135–145)
Total Bilirubin: 0.8 mg/dL (ref 0.3–1.2)
Total Protein: 8.2 g/dL — ABNORMAL HIGH (ref 6.5–8.1)

## 2019-10-06 LAB — LIPASE, BLOOD: Lipase: 31 U/L (ref 11–51)

## 2019-10-06 LAB — CBC
HCT: 37.9 % (ref 36.0–46.0)
Hemoglobin: 12.3 g/dL (ref 12.0–15.0)
MCH: 34.5 pg — ABNORMAL HIGH (ref 26.0–34.0)
MCHC: 32.5 g/dL (ref 30.0–36.0)
MCV: 106.2 fL — ABNORMAL HIGH (ref 80.0–100.0)
Platelets: 315 10*3/uL (ref 150–400)
RBC: 3.57 MIL/uL — ABNORMAL LOW (ref 3.87–5.11)
RDW: 11.6 % (ref 11.5–15.5)
WBC: 5.9 10*3/uL (ref 4.0–10.5)
nRBC: 0 % (ref 0.0–0.2)

## 2019-10-06 LAB — I-STAT BETA HCG BLOOD, ED (MC, WL, AP ONLY): I-stat hCG, quantitative: <5 m[IU]/mL (ref <5)

## 2019-10-06 MED ORDER — MORPHINE SULFATE (PF) 4 MG/ML IV SOLN
4.0000 mg | Freq: Once | INTRAVENOUS | Status: AC
  Administered 2019-10-06: 4 mg via INTRAVENOUS
  Filled 2019-10-06: qty 1

## 2019-10-06 MED ORDER — IOHEXOL 300 MG/ML  SOLN
100.0000 mL | Freq: Once | INTRAMUSCULAR | Status: AC | PRN
  Administered 2019-10-06: 100 mL via INTRAVENOUS

## 2019-10-06 MED ORDER — SODIUM CHLORIDE (PF) 0.9 % IJ SOLN
INTRAMUSCULAR | Status: AC
  Filled 2019-10-06: qty 50

## 2019-10-06 MED ORDER — SODIUM CHLORIDE 0.9% FLUSH: 3.0000 mL | Freq: Once | INTRAVENOUS | Status: DC

## 2019-10-06 MED ORDER — SODIUM CHLORIDE 0.9 % IV BOLUS
1000.0000 mL | Freq: Once | INTRAVENOUS | Status: AC
  Administered 2019-10-06: 1000 mL via INTRAVENOUS

## 2019-10-06 MED ORDER — ONDANSETRON HCL 4 MG/2ML IJ SOLN
4.0000 mg | Freq: Once | INTRAMUSCULAR | Status: AC
  Administered 2019-10-06: 4 mg via INTRAVENOUS
  Filled 2019-10-06: qty 2

## 2019-10-06 NOTE — ED Provider Notes (Signed)
[facility] DEPT Provider Note   CSN: [id number] Arrival date & time: 10/06/19  1303     History Chief Complaint  Patient presents with  . Abdominal Pain  . Back Pain  . Diarrhea    Emma Harris is a 54 y.o. female.  Patient is a 54 year old female with past medical history of hypertension and prior cholecystectomy.  She presents today for evaluation of abdominal pain that started yesterday evening.  She describes pain in the epigastric region that radiates through to her back.  She is also had several episodes of loose stools that have been nonbloody and nonmelanotic.  Patient states she has had similar episodes over the past year, however no cause has been found.  She apparently had an endoscopy however this was inconclusive.  The history is provided by the patient.  Abdominal Pain Pain location:  Epigastric Pain quality: cramping   Pain radiates to:  L leg Pain severity:  Moderate Onset quality:  Sudden Timing:  Constant Progression:  Worsening Chronicity:  Recurrent Relieved by:  Nothing Worsened by:  Nothing Ineffective treatments:  None tried Associated symptoms: diarrhea   Back Pain Associated symptoms: abdominal pain   Diarrhea Associated symptoms: abdominal pain        Past Medical History:  Diagnosis Date  . Hypertension     Patient Active Problem List   Diagnosis Date Noted  . Lower abdominal pain 03/05/2019  . Subclinical hypothyroidism 03/05/2019    Past Surgical History:  Procedure Laterality Date  . CHOLECYSTECTOMY  2006  . FRACTURE SURGERY Left    Knee  . umbilical surgery     as a child. patient unsure of procedure     OB History    Gravida  0   Para  0   Term  0   Preterm  0   AB  0   Living  0     SAB  0   TAB  0   Ectopic  0   Multiple  0   Live Births  0           Family History  Problem Relation Age of Onset  . Hypertension Mother     Social History   Tobacco Use    . Smoking status: Never Smoker  . Smokeless tobacco: Never Used  Substance Use Topics  . Alcohol use: Yes    Comment: rare  . Drug use: No    Home Medications Prior to Admission medications   Medication Sig Start Date End Date Taking? Authorizing Provider  amLODipine (NORVASC) 5 MG tablet Take 5 mg by mouth daily. 05/28/18   [provider]  ketorolac (TORADOL) 10 MG tablet Take 1 tablet (10 mg total) by mouth every 6 (six) hours as needed. Patient not taking: Reported on 04/22/2019 03/05/19   Aletha Halim, MD  lidocaine (XYLOCAINE) 2 % solution Use as directed 10 mLs in the mouth or throat every 6 (six) hours as needed for mouth pain. 05/20/19   Ward, Delice Bison, DO  omeprazole (PRILOSEC) 40 MG capsule Take 40 mg by mouth daily. 04/13/19   [provider]  omeprazole (PRILOSEC) 40 MG capsule Take 1 capsule (40 mg total) by mouth daily. 05/20/19   Ward, Delice Bison, DO  ondansetron (ZOFRAN ODT) 4 MG disintegrating tablet Take 1 tablet (4 mg total) by mouth every 6 (six) hours as needed for nausea or vomiting. 05/20/19   Ward, Delice Bison, DO  promethazine (PHENERGAN) 12.5 MG tablet  Take 12.5 mg by mouth 3 (three) times daily as needed for nausea/vomiting. 04/16/19   [provider]  promethazine (PHENERGAN) 25 MG suppository Place 1 suppository (25 mg total) rectally every 6 (six) hours as needed for nausea. Use in place of oral medication if you are vomiting 04/22/19 04/21/20  Wilber Oliphant, MD    Allergies    Shellfish allergy and Codeine  Review of Systems   Review of Systems  Gastrointestinal: Positive for abdominal pain and diarrhea.  Musculoskeletal: Positive for back pain.  All other systems reviewed and are negative.   Physical Exam Updated Vital Signs BP 129/74   Pulse 68   Temp 98.1 F (36.7 C) (Oral)   Resp 18   SpO2 100%   Physical Exam Vitals and nursing note reviewed.  Constitutional:      General: She is not in acute distress.     Appearance: She is well-developed. She is not diaphoretic.  HENT:     Head: Normocephalic and atraumatic.  Cardiovascular:     Rate and Rhythm: Normal rate and regular rhythm.     Heart sounds: No murmur. No friction rub. No gallop.   Pulmonary:     Effort: Pulmonary effort is normal. No respiratory distress.     Breath sounds: Normal breath sounds. No wheezing.  Abdominal:     General: Bowel sounds are normal. There is no distension.     Palpations: Abdomen is soft.     Tenderness: There is abdominal tenderness in the epigastric area. There is left CVA tenderness. There is no guarding or rebound.  Musculoskeletal:        General: Normal range of motion.     Cervical back: Normal range of motion and neck supple.  Skin:    General: Skin is warm and dry.  Neurological:     Mental Status: She is alert and oriented to person, place, and time.     ED Results / Procedures / Treatments   Labs (all labs ordered are listed, but only abnormal results are displayed) Labs Reviewed  COMPREHENSIVE METABOLIC PANEL - Abnormal; Notable for the following components:      Result Value   Total Protein 8.2 (*)    All other components within normal limits  CBC - Abnormal; Notable for the following components:   RBC 3.57 (*)    MCV 106.2 (*)    MCH 34.5 (*)    All other components within normal limits  URINALYSIS, ROUTINE W REFLEX MICROSCOPIC - Abnormal; Notable for the following components:   pH 9.0 (*)    Ketones, ur 20 (*)    Protein, ur 30 (*)    Leukocytes,Ua LARGE (*)    Bacteria, UA RARE (*)    All other components within normal limits  LIPASE, BLOOD  I-STAT BETA HCG BLOOD, ED ([facility], [facility], [facility] ONLY)    EKG None  Radiology No results found.  Procedures Procedures (including critical care time)  Medications Ordered in ED Medications  sodium chloride flush (NS) 0.9 % injection 3 mL (3 mLs Intravenous Not Given 10/06/19 2038)  sodium chloride 0.9 % bolus 1,000 mL (has no  administration in time range)  morphine 4 MG/ML injection 4 mg (has no administration in time range)  ondansetron (ZOFRAN) injection 4 mg (has no administration in time range)    ED Course  I have reviewed the triage vital signs and the nursing notes.  Pertinent labs & imaging results that were available during my care of  the patient were reviewed by me and considered in my medical decision making (see chart for details).    MDM Rules/Calculators/A&P  Patient presenting here with complaints of epigastric discomfort that radiates to her left flank.  This has been present for the past 24 hours.  Patient has had similar episodes over the past year that have been thus far unexplained.  She denies to me she is having any fevers or chills or any vomiting or bowel complaints.  On exam, vitals are stable and patient is afebrile.  Her abdomen reveals tenderness in the epigastric region with no rebound or guarding.  Work-up shows no elevation of white count and normal LFTs, lipase, and CT scan of the abdomen and pelvis.  I am uncertain as to the exact etiology of her discomfort, however nothing appears emergent.  I feel as though discharge with outpatient follow-up is appropriate.  She has had an endoscopy in the past and I have advised her to follow-up with her GI doctor if she is not improving.  She is to increase her omeprazole to 20 mg twice daily for the next 2 weeks.  Final Clinical Impression(s) / ED Diagnoses Final diagnoses:  None    Rx / DC Orders ED Discharge Orders    None       Veryl Speak, MD 10/07/19 [phone]

## 2019-10-06 NOTE — ED Triage Notes (Signed)
Pt abd and back pains with diarrhea that started last night. Pain got worse today.

## 2019-10-07 MED ORDER — LIDOCAINE VISCOUS HCL 2 % MT SOLN
15.0000 mL | Freq: Once | OROMUCOSAL | Status: AC
  Administered 2019-10-07: 15 mL via ORAL
  Filled 2019-10-07: qty 15

## 2019-10-07 MED ORDER — ALUM & MAG HYDROXIDE-SIMETH 200-200-20 MG/5ML PO SUSP
30.0000 mL | Freq: Once | ORAL | Status: AC
  Administered 2019-10-07: 01:00:00 30 mL via ORAL
  Filled 2019-10-07: qty 30

## 2019-10-07 MED ORDER — TRAMADOL HCL 50 MG PO TABS: 50.0000 mg | ORAL_TABLET | Freq: Four times a day (QID) | ORAL | 0 refills | Status: DC | PRN

## 2019-10-07 NOTE — Discharge Instructions (Signed)
Increase your omeprazole to 20 mg twice daily.  Begin taking tramadol as prescribed as needed for pain.  Follow-up with your gastroenterologist if your symptoms are not improving in the next week.

## 2019-10-10 ENCOUNTER — Other Ambulatory Visit: Payer: Self-pay | Admitting: Obstetrics & Gynecology

## 2019-10-10 DIAGNOSIS — R102 Pelvic and perineal pain: Secondary | ICD-10-CM

## 2019-10-15 ENCOUNTER — Ambulatory Visit
Admission: RE | Admit: 2019-10-15 | Discharge: 2019-10-15 | Disposition: A | Discharge status: Home / Self Care | Payer: 59 | Source: Ambulatory Visit | Attending: Obstetrics & Gynecology | Admitting: Obstetrics & Gynecology

## 2019-10-15 DIAGNOSIS — R102 Pelvic and perineal pain: Secondary | ICD-10-CM

## 2019-11-26 ENCOUNTER — Ambulatory Visit: Payer: 59 | Admitting: Obstetrics & Gynecology

## 2020-02-22 ENCOUNTER — Other Ambulatory Visit: Payer: Self-pay

## 2020-02-22 ENCOUNTER — Emergency Department (HOSPITAL_COMMUNITY)
Admission: EM | Admit: 2020-02-22 | Discharge: 2020-02-22 | Disposition: A | Discharge status: Home / Self Care | Payer: 59 | Attending: Emergency Medicine | Admitting: Emergency Medicine

## 2020-02-22 ENCOUNTER — Encounter (HOSPITAL_COMMUNITY): Payer: Self-pay | Admitting: Emergency Medicine

## 2020-02-22 DIAGNOSIS — N390 Urinary tract infection, site not specified: Secondary | ICD-10-CM | POA: Diagnosis not present

## 2020-02-22 DIAGNOSIS — E039 Hypothyroidism, unspecified: Secondary | ICD-10-CM | POA: Diagnosis not present

## 2020-02-22 DIAGNOSIS — R1033 Periumbilical pain: Secondary | ICD-10-CM | POA: Diagnosis not present

## 2020-02-22 DIAGNOSIS — Z79899 Other long term (current) drug therapy: Secondary | ICD-10-CM | POA: Insufficient documentation

## 2020-02-22 DIAGNOSIS — I10 Essential (primary) hypertension: Secondary | ICD-10-CM | POA: Insufficient documentation

## 2020-02-22 DIAGNOSIS — R1012 Left upper quadrant pain: Secondary | ICD-10-CM | POA: Diagnosis not present

## 2020-02-22 LAB — URINALYSIS, ROUTINE W REFLEX MICROSCOPIC
Bilirubin Urine: NEGATIVE
Glucose, UA: NEGATIVE mg/dL
Hgb urine dipstick: NEGATIVE
Ketones, ur: NEGATIVE mg/dL
Nitrite: NEGATIVE
Protein, ur: NEGATIVE mg/dL
Specific Gravity, Urine: 1.018 (ref 1.005–1.030)
WBC, UA: >50 WBC/hpf — ABNORMAL HIGH (ref 0–5)
pH: 5 (ref 5.0–8.0)

## 2020-02-22 LAB — COMPREHENSIVE METABOLIC PANEL
ALT: 22 U/L (ref 0–44)
AST: 24 U/L (ref 15–41)
Albumin: 4.2 g/dL (ref 3.5–5.0)
Alkaline Phosphatase: 79 U/L (ref 38–126)
Anion gap: 9 (ref 5–15)
BUN: 14 mg/dL (ref 6–20)
CO2: 24 mmol/L (ref 22–32)
Calcium: 9.7 mg/dL (ref 8.9–10.3)
Chloride: 106 mmol/L (ref 98–111)
Creatinine, Ser: 0.73 mg/dL (ref 0.44–1.00)
GFR calc Af Amer: >60 mL/min (ref >60)
GFR calc non Af Amer: >60 mL/min (ref >60)
Glucose, Bld: 88 mg/dL (ref 70–99)
Potassium: 4.4 mmol/L (ref 3.5–5.1)
Sodium: 139 mmol/L (ref 135–145)
Total Bilirubin: 0.2 mg/dL — ABNORMAL LOW (ref 0.3–1.2)
Total Protein: 8 g/dL (ref 6.5–8.1)

## 2020-02-22 LAB — CBC
HCT: 38.9 % (ref 36.0–46.0)
Hemoglobin: 12.7 g/dL (ref 12.0–15.0)
MCH: 34.1 pg — ABNORMAL HIGH (ref 26.0–34.0)
MCHC: 32.6 g/dL (ref 30.0–36.0)
MCV: 104.6 fL — ABNORMAL HIGH (ref 80.0–100.0)
Platelets: 308 10*3/uL (ref 150–400)
RBC: 3.72 MIL/uL — ABNORMAL LOW (ref 3.87–5.11)
RDW: 11.4 % — ABNORMAL LOW (ref 11.5–15.5)
WBC: 5 10*3/uL (ref 4.0–10.5)
nRBC: 0 % (ref 0.0–0.2)

## 2020-02-22 LAB — LIPASE, BLOOD: Lipase: 37 U/L (ref 11–51)

## 2020-02-22 LAB — I-STAT BETA HCG BLOOD, ED (MC, WL, AP ONLY): I-stat hCG, quantitative: <5 m[IU]/mL (ref <5)

## 2020-02-22 MED ORDER — CEPHALEXIN 500 MG PO CAPS: 500.0000 mg | ORAL_CAPSULE | Freq: Two times a day (BID) | ORAL | 0 refills | Status: AC

## 2020-02-22 MED ORDER — CEPHALEXIN 500 MG PO CAPS
500.0000 mg | ORAL_CAPSULE | Freq: Once | ORAL | Status: AC
  Administered 2020-02-22: 500 mg via ORAL
  Filled 2020-02-22: qty 1

## 2020-02-22 NOTE — ED Triage Notes (Signed)
Patient here from home reporting abd pain radiating around into back. Denies n/v. Hx of gallbladder removal.

## 2020-02-22 NOTE — ED Notes (Signed)
Pt ambulated to the restroom gait steady. No assistance.

## 2020-02-22 NOTE — ED Provider Notes (Signed)
[facility] DEPT Provider Note   CSN: [id number] Arrival date & time: 02/22/20  1803     History Chief Complaint  Patient presents with  . Abdominal Pain    Emma Harris is a 53 y.o. female.  HPI    Patient presents with abdominal pain. Pain is focally in the left periumbilical area and left side. Pain is sore, moderate, without associated dysuria, nausea, vomiting, fever, chills, chest pain, dyspnea. No medication taken for relief. Onset was 3 days ago, the patient went to urgent care the day afterwards, but they were not accepting walk-in patients. She notes a history of gastritis for which she is taking her medication regularly, denies sternal discomfort, burning sensation.  She also notes a history of urinary tract infection with somewhat similar presentation to today's.   Past Medical History:  Diagnosis Date  . Hypertension     Patient Active Problem List   Diagnosis Date Noted  . Lower abdominal pain 03/05/2019  . Subclinical hypothyroidism 03/05/2019    Past Surgical History:  Procedure Laterality Date  . CHOLECYSTECTOMY  2006  . FRACTURE SURGERY Left    Knee  . umbilical surgery     as a child. patient unsure of procedure     OB History    Gravida  0   Para  0   Term  0   Preterm  0   AB  0   Living  0     SAB  0   TAB  0   Ectopic  0   Multiple  0   Live Births  0           Family History  Problem Relation Age of Onset  . Hypertension Mother     Social History   Tobacco Use  . Smoking status: Never Smoker  . Smokeless tobacco: Never Used  Vaping Use  . Vaping Use: Never used  Substance Use Topics  . Alcohol use: Yes    Comment: rare  . Drug use: No    Home Medications Prior to Admission medications   Medication Sig Start Date End Date Taking? Authorizing Provider  amLODipine (NORVASC) 5 MG tablet Take 5 mg by mouth daily. 05/28/18   [provider]  ketorolac (TORADOL)  10 MG tablet Take 1 tablet (10 mg total) by mouth every 6 (six) hours as needed. Patient not taking: Reported on 04/22/2019 03/05/19   Aletha Halim, MD  lidocaine (XYLOCAINE) 2 % solution Use as directed 10 mLs in the mouth or throat every 6 (six) hours as needed for mouth pain. Patient not taking: Reported on 10/06/2019 05/20/19   Ward, Delice Bison, DO  omeprazole (PRILOSEC) 40 MG capsule Take 1 capsule (40 mg total) by mouth daily. 05/20/19   Ward, Delice Bison, DO  ondansetron (ZOFRAN ODT) 4 MG disintegrating tablet Take 1 tablet (4 mg total) by mouth every 6 (six) hours as needed for nausea or vomiting. Patient not taking: Reported on 10/06/2019 05/20/19   Ward, Delice Bison, DO  promethazine (PHENERGAN) 25 MG suppository Place 1 suppository (25 mg total) rectally every 6 (six) hours as needed for nausea. Use in place of oral medication if you are vomiting Patient not taking: Reported on 10/06/2019 04/22/19 04/21/20  Wilber Oliphant, MD  traMADol (ULTRAM) 50 MG tablet Take 1 tablet (50 mg total) by mouth every 6 (six) hours as needed. 10/07/19   Veryl Speak, MD    Allergies    Shellfish allergy and  Codeine  Review of Systems   Review of Systems  Constitutional:       Per HPI, otherwise negative  HENT:       Per HPI, otherwise negative  Respiratory:       Per HPI, otherwise negative  Cardiovascular:       Per HPI, otherwise negative  Gastrointestinal: Negative for vomiting.  Endocrine:       Negative aside from HPI  Genitourinary:       Neg aside from HPI   Musculoskeletal:       Per HPI, otherwise negative  Skin: Negative.   Neurological: Negative for syncope.    Physical Exam Updated Vital Signs BP 122/80 (BP Location: Right Arm)   Pulse 75   Temp 99.1 F (37.3 C) (Oral)   Resp 16   SpO2 100%   Physical Exam Vitals and nursing note reviewed.  Constitutional:      General: She is not in acute distress.    Appearance: She is well-developed.  HENT:     Head: Normocephalic  and atraumatic.  Eyes:     Conjunctiva/sclera: Conjunctivae normal.  Cardiovascular:     Rate and Rhythm: Normal rate and regular rhythm.  Pulmonary:     Effort: Pulmonary effort is normal. No respiratory distress.     Breath sounds: Normal breath sounds. No stridor.  Abdominal:     General: There is no distension.     Tenderness: There is no abdominal tenderness. There is no left CVA tenderness, guarding or rebound.  Skin:    General: Skin is warm and dry.  Neurological:     Mental Status: She is alert and oriented to person, place, and time.     Cranial Nerves: No cranial nerve deficit.     ED Results / Procedures / Treatments   Labs (all labs ordered are listed, but only abnormal results are displayed) Labs Reviewed  COMPREHENSIVE METABOLIC PANEL - Abnormal; Notable for the following components:      Result Value   Total Bilirubin 0.2 (*)    All other components within normal limits  CBC - Abnormal; Notable for the following components:   RBC 3.72 (*)    MCV 104.6 (*)    MCH 34.1 (*)    RDW 11.4 (*)    All other components within normal limits  URINALYSIS, ROUTINE W REFLEX MICROSCOPIC - Abnormal; Notable for the following components:   Leukocytes,Ua LARGE (*)    WBC, UA >50 (*)    Bacteria, UA RARE (*)    All other components within normal limits  LIPASE, BLOOD  I-STAT BETA HCG BLOOD, ED ([facility], [facility], [facility] ONLY)   Procedures Procedures (including critical care time)  Medications Ordered in ED Medications  cephALEXin (KEFLEX) capsule 500 mg (has no administration in time range)    ED Course  I have reviewed the triage vital signs and the nursing notes.  Pertinent labs & imaging results that were available during my care of the patient were reviewed by me and considered in my medical decision making (see chart for details).    MDM Rules/Calculators/A&P                          10:13 PM Patient awake, alert, sitting upright.  We discussed all findings including  urinalysis with leukocytes, greater than 50 white blood cells per field.  Absent other complaints, including other abdominal pain, fever, chills, dyspnea, low suspicion for atypical pneumonia, no  indication for abdominal imaging given the absence of peritonitis.  Some suspicion for patient's gastric pathology contributing to her presentation but given her description of prior urinary tract infection with similar symptoms, abnormal urinalysis, patient will start antibiotics per Patient appropriate for discharge with outpatient follow-up.  Final Clinical Impression(s) / ED Diagnoses Final diagnoses:  Lower urinary tract infectious disease  Left upper quadrant abdominal pain    Rx / DC Orders ED Discharge Orders         Ordered    cephALEXin (KEFLEX) 500 MG capsule  2 times daily        02/22/20 2214           Carmin Muskrat, MD 02/22/20 2214

## 2020-02-22 NOTE — Discharge Instructions (Signed)
As discussed, your evaluation today has been largely reassuring.  But, it is important that you monitor your condition carefully, and do not hesitate to return to the ED if you develop new, or concerning changes in your condition. ? ?Otherwise, please follow-up with your physician for appropriate ongoing care. ? ?

## 2020-03-10 ENCOUNTER — Emergency Department (HOSPITAL_BASED_OUTPATIENT_CLINIC_OR_DEPARTMENT_OTHER): Payer: 59

## 2020-03-10 ENCOUNTER — Emergency Department (HOSPITAL_BASED_OUTPATIENT_CLINIC_OR_DEPARTMENT_OTHER)
Admission: EM | Admit: 2020-03-10 | Discharge: 2020-03-11 | Disposition: A | Discharge status: Home / Self Care | Payer: 59 | Attending: Emergency Medicine | Admitting: Emergency Medicine

## 2020-03-10 ENCOUNTER — Encounter (HOSPITAL_BASED_OUTPATIENT_CLINIC_OR_DEPARTMENT_OTHER): Payer: Self-pay

## 2020-03-10 ENCOUNTER — Other Ambulatory Visit: Payer: Self-pay

## 2020-03-10 DIAGNOSIS — X501XXA Overexertion from prolonged static or awkward postures, initial encounter: Secondary | ICD-10-CM | POA: Diagnosis not present

## 2020-03-10 DIAGNOSIS — S46911A Strain of unspecified muscle, fascia and tendon at shoulder and upper arm level, right arm, initial encounter: Secondary | ICD-10-CM

## 2020-03-10 DIAGNOSIS — M545 Low back pain: Secondary | ICD-10-CM | POA: Diagnosis not present

## 2020-03-10 DIAGNOSIS — Z79899 Other long term (current) drug therapy: Secondary | ICD-10-CM | POA: Diagnosis not present

## 2020-03-10 DIAGNOSIS — I10 Essential (primary) hypertension: Secondary | ICD-10-CM | POA: Diagnosis not present

## 2020-03-10 DIAGNOSIS — S4991XA Unspecified injury of right shoulder and upper arm, initial encounter: Secondary | ICD-10-CM | POA: Diagnosis present

## 2020-03-10 MED ORDER — HYDROCODONE-ACETAMINOPHEN 5-325 MG PO TABS
1.0000 | ORAL_TABLET | Freq: Once | ORAL | Status: AC
  Administered 2020-03-11: 1 via ORAL
  Filled 2020-03-10: qty 1

## 2020-03-10 MED ORDER — IBUPROFEN 400 MG PO TABS
400.0000 mg | ORAL_TABLET | Freq: Once | ORAL | Status: AC
  Administered 2020-03-11: 400 mg via ORAL
  Filled 2020-03-10: qty 1

## 2020-03-10 NOTE — ED Triage Notes (Addendum)
Pt c/o "right middle sholuder blade pain" x 2-3 days-denies injury-NAD-to triage in w/c-transported to ED by [facility]-report to Jennye Boroughs, charge RN

## 2020-03-10 NOTE — ED Triage Notes (Signed)
Patient presents via EMS with complaints of right upper back pain; denies any known trauma; states she thinks its from "repeative motion" while at work. Ambulatory on scene per EMS. States took Robaxin PTA

## 2020-03-11 NOTE — ED Provider Notes (Signed)
[facility] EMERGENCY DEPARTMENT Provider Note   CSN: [id number] Arrival date & time: 03/10/20  2032     History Chief Complaint  Patient presents with  . Shoulder blade pain    Emma Hendriks is a 54 y.o. female.  The history is provided by the patient.  Back Pain Quality:  Aching Radiates to: Chest. Pain severity:  Moderate Onset quality:  Sudden Timing:  Intermittent Progression:  Worsening Chronicity:  Recurrent Relieved by:  Nothing Worsened by:  Bending, deep breathing, movement and twisting Associated symptoms: no abdominal pain, no bladder incontinence, no bowel incontinence, no fever, no headaches, no numbness and no weakness    Patient presents for right upper back and scapular pain.  Denies trauma.  This episode began at approximately 8 PM on September 15.  She reports that she gets these episodes frequently and has been seen previously.  She reports repetitive motion at work but denies trauma.  This episode occurred at rest.  She reports it starts in her upper back and will at times within her chest.  No other anterior chest pain.  No shortness of breath.  No fevers or cough.  No vomiting or diarrhea.  No history of CAD/VTE.  No focal arm or leg weakness.  She did take a Robaxin prior to arrival.   Patient reports chronic abdominal pain.  Reports previous history of cholecystectomy Past Medical History:  Diagnosis Date  . Hypertension     Patient Active Problem List   Diagnosis Date Noted  . Lower abdominal pain 03/05/2019  . Subclinical hypothyroidism 03/05/2019    Past Surgical History:  Procedure Laterality Date  . CHOLECYSTECTOMY  2006  . FRACTURE SURGERY Left    Knee  . umbilical surgery     as a child. patient unsure of procedure     OB History    Gravida  0   Para  0   Term  0   Preterm  0   AB  0   Living  0     SAB  0   TAB  0   Ectopic  0   Multiple  0   Live Births  0           Family History  Problem  Relation Age of Onset  . Hypertension Mother     Social History   Tobacco Use  . Smoking status: Never Smoker  . Smokeless tobacco: Never Used  Vaping Use  . Vaping Use: Never used  Substance Use Topics  . Alcohol use: Yes    Comment: rare  . Drug use: No    Home Medications Prior to Admission medications   Medication Sig Start Date End Date Taking? Authorizing Provider  amLODipine (NORVASC) 5 MG tablet Take 5 mg by mouth daily. 05/28/18   [provider]  omeprazole (PRILOSEC) 40 MG capsule Take 1 capsule (40 mg total) by mouth daily. 05/20/19   Ward, Delice Bison, DO  traMADol (ULTRAM) 50 MG tablet Take 1 tablet (50 mg total) by mouth every 6 (six) hours as needed. 10/07/19   Veryl Speak, MD  promethazine (PHENERGAN) 25 MG suppository Place 1 suppository (25 mg total) rectally every 6 (six) hours as needed for nausea. Use in place of oral medication if you are vomiting Patient not taking: Reported on 10/06/2019 04/22/19 03/11/20  Wilber Oliphant, MD    Allergies    Shellfish allergy and Codeine  Review of Systems   Review of Systems  Constitutional:  Negative for fever.  Respiratory: Negative for shortness of breath.   Gastrointestinal: Negative for abdominal pain, bowel incontinence and vomiting.  Genitourinary: Negative for bladder incontinence.  Musculoskeletal: Positive for back pain.  Neurological: Negative for weakness, numbness and headaches.  All other systems reviewed and are negative.   Physical Exam Updated Vital Signs BP 131/89 (BP Location: Right Arm)   Pulse 87   Temp 98.2 F (36.8 C) (Oral)   Resp 18   Ht 1.549 m (5\' 1" )   Wt 60.8 kg   SpO2 100%   BMI 25.32 kg/m   Physical Exam CONSTITUTIONAL: Well developed/well nourished HEAD: Normocephalic/atraumatic EYES: EOMI/PERRL ENMT: Mucous membranes moist NECK: supple no meningeal signs SPINE/BACK:entire spine nontender, right thoracic paraspinal tenderness, no rash or bruising CV: S1/S2 noted,  no murmurs/rubs/gallops noted LUNGS: Lungs are clear to auscultation bilaterally, no apparent distress ABDOMEN: soft, nontender, no rebound or guarding GU:no cva tenderness NEURO: Awake/alert,equal motor 5/5 strength noted with the following: hip flexion/knee flexion/extension, foot dorsi/plantar flexion Pt is able to ambulate unassisted. She has equal and appropriate strength in bilateral upper extremities EXTREMITIES: pulses normalx4, full ROM, mild tenderness to right scapula, no deformity SKIN: warm, color normal PSYCH: no abnormalities of mood noted, alert and oriented to situation   ED Results / Procedures / Treatments   Labs (all labs ordered are listed, but only abnormal results are displayed) Labs Reviewed - No data to display  EKG EKG Interpretation  Date/Time:  Wednesday March 10 2020 23:48:03 EDT Ventricular Rate:  99 PR Interval:    QRS Duration: 72 QT Interval:  331 QTC Calculation: 425 R Axis:   26 Text Interpretation: Sinus rhythm Abnormal R-wave progression, early transition Nonspecific T abnormalities, anterior leads Interpretation limited secondary to artifact Confirmed by Ripley Fraise [phone]) on 03/10/2020 11:55:18 PM   Radiology DG Chest 2 View  Result Date: 03/11/2020 CLINICAL DATA:  Chest pain EXAM: CHEST - 2 VIEW COMPARISON:  09/06/2016 FINDINGS: Mild left basilar atelectasis. Lungs are otherwise clear. No pneumothorax or pleural effusion. Cardiac size within normal limits. Pulmonary vascularity normal. Moderate to severe thoracic dextroscoliosis again noted. IMPRESSION: Mild left basilar atelectasis. Electronically Signed   By: Fidela Salisbury MD   On: 03/11/2020 00:13    Procedures Procedures  Medications Ordered in ED Medications  HYDROcodone-acetaminophen (NORCO/VICODIN) 5-325 MG per tablet 1 tablet (1 tablet Oral Given 03/11/20 0011)  ibuprofen (ADVIL) tablet 400 mg (400 mg Oral Given 03/11/20 0011)    ED Course  I have reviewed the triage  vital signs and the nursing notes.  Pertinent  imaging results that were available during my care of the patient were reviewed by me and considered in my medical decision making (see chart for details).    MDM Rules/Calculators/A&P                          Patient reports long history of frequent episodes of right scapular pain.  She has been seen by for this previously as noted in her chart.  X-ray tonight is negative.  EKG is reassuring without acute changes.  No hypoxia.  Low suspicion for acute PE at this time.  Low suspicion for acute coronary syndrome, low suspicion for acute dissection. No focal weakness noted at this time.  Will be discharged home and treat for muscle strain Patient reports she has had physical therapy for and encouraged to do that again.  She was referred to sports medicine.  This could be  related to her underlying scoliosis. Final Clinical Impression(s) / ED Diagnoses Final diagnoses:  Muscle strain of scapular region, right, initial encounter    Rx / DC Orders ED Discharge Orders    None       Ripley Fraise, MD 03/11/20 0111

## 2020-03-12 ENCOUNTER — Encounter (HOSPITAL_COMMUNITY): Payer: Self-pay

## 2020-03-12 ENCOUNTER — Emergency Department (HOSPITAL_COMMUNITY)
Admission: EM | Admit: 2020-03-12 | Discharge: 2020-03-12 | Disposition: A | Discharge status: Home / Self Care | Payer: 59 | Attending: Emergency Medicine | Admitting: Emergency Medicine

## 2020-03-12 ENCOUNTER — Other Ambulatory Visit: Payer: Self-pay

## 2020-03-12 ENCOUNTER — Emergency Department (HOSPITAL_COMMUNITY): Payer: 59

## 2020-03-12 DIAGNOSIS — Z5321 Procedure and treatment not carried out due to patient leaving prior to being seen by health care provider: Secondary | ICD-10-CM | POA: Insufficient documentation

## 2020-03-12 DIAGNOSIS — M25511 Pain in right shoulder: Secondary | ICD-10-CM | POA: Diagnosis present

## 2020-03-12 LAB — BASIC METABOLIC PANEL
Anion gap: 10 (ref 5–15)
BUN: 19 mg/dL (ref 6–20)
CO2: 24 mmol/L (ref 22–32)
Calcium: 9.9 mg/dL (ref 8.9–10.3)
Chloride: 105 mmol/L (ref 98–111)
Creatinine, Ser: 0.75 mg/dL (ref 0.44–1.00)
GFR calc Af Amer: >60 mL/min (ref >60)
GFR calc non Af Amer: >60 mL/min (ref >60)
Glucose, Bld: 95 mg/dL (ref 70–99)
Potassium: 3.8 mmol/L (ref 3.5–5.1)
Sodium: 139 mmol/L (ref 135–145)

## 2020-03-12 LAB — CBC
HCT: 38.5 % (ref 36.0–46.0)
Hemoglobin: 12.6 g/dL (ref 12.0–15.0)
MCH: 34.2 pg — ABNORMAL HIGH (ref 26.0–34.0)
MCHC: 32.7 g/dL (ref 30.0–36.0)
MCV: 104.6 fL — ABNORMAL HIGH (ref 80.0–100.0)
Platelets: 277 10*3/uL (ref 150–400)
RBC: 3.68 MIL/uL — ABNORMAL LOW (ref 3.87–5.11)
RDW: 11.3 % — ABNORMAL LOW (ref 11.5–15.5)
WBC: 4.9 10*3/uL (ref 4.0–10.5)
nRBC: 0 % (ref 0.0–0.2)

## 2020-03-12 LAB — TROPONIN I (HIGH SENSITIVITY): Troponin I (High Sensitivity): 10 ng/L (ref <18)

## 2020-03-12 NOTE — ED Notes (Signed)
Unable to check patient vitals sign patient gave her sticker to screeners and left

## 2020-03-12 NOTE — ED Triage Notes (Signed)
Patient arrived with complaints of right shoulder pain that radiates to her right chest and back that started on 9/15. Patient seen and discharged with muscle relaxer's. States pain worsened tonight with movement. Patient told ems she did not take prescribed medication prior to arrival.

## 2020-06-06 ENCOUNTER — Encounter (HOSPITAL_COMMUNITY): Payer: Self-pay | Admitting: Emergency Medicine

## 2020-06-06 ENCOUNTER — Emergency Department (HOSPITAL_COMMUNITY): Payer: 59

## 2020-06-06 ENCOUNTER — Other Ambulatory Visit: Payer: Self-pay

## 2020-06-06 ENCOUNTER — Emergency Department (HOSPITAL_COMMUNITY)
Admission: EM | Admit: 2020-06-06 | Discharge: 2020-06-06 | Disposition: A | Discharge status: Home / Self Care | Payer: 59 | Attending: Emergency Medicine | Admitting: Emergency Medicine

## 2020-06-06 DIAGNOSIS — R079 Chest pain, unspecified: Secondary | ICD-10-CM | POA: Diagnosis present

## 2020-06-06 DIAGNOSIS — R0789 Other chest pain: Secondary | ICD-10-CM | POA: Diagnosis not present

## 2020-06-06 DIAGNOSIS — I10 Essential (primary) hypertension: Secondary | ICD-10-CM | POA: Insufficient documentation

## 2020-06-06 DIAGNOSIS — Z79899 Other long term (current) drug therapy: Secondary | ICD-10-CM | POA: Diagnosis not present

## 2020-06-06 DIAGNOSIS — R202 Paresthesia of skin: Secondary | ICD-10-CM | POA: Diagnosis not present

## 2020-06-06 HISTORY — DX: Gastro-esophageal reflux disease without esophagitis: K21.9

## 2020-06-06 LAB — CBC
HCT: 37.4 % (ref 36.0–46.0)
Hemoglobin: 12.1 g/dL (ref 12.0–15.0)
MCH: 34.4 pg — ABNORMAL HIGH (ref 26.0–34.0)
MCHC: 32.4 g/dL (ref 30.0–36.0)
MCV: 106.3 fL — ABNORMAL HIGH (ref 80.0–100.0)
Platelets: 278 10*3/uL (ref 150–400)
RBC: 3.52 MIL/uL — ABNORMAL LOW (ref 3.87–5.11)
RDW: 11.7 % (ref 11.5–15.5)
WBC: 5 10*3/uL (ref 4.0–10.5)
nRBC: 0 % (ref 0.0–0.2)

## 2020-06-06 LAB — BASIC METABOLIC PANEL
Anion gap: 10 (ref 5–15)
BUN: 11 mg/dL (ref 6–20)
CO2: 21 mmol/L — ABNORMAL LOW (ref 22–32)
Calcium: 9.5 mg/dL (ref 8.9–10.3)
Chloride: 107 mmol/L (ref 98–111)
Creatinine, Ser: 0.6 mg/dL (ref 0.44–1.00)
GFR, Estimated: >60 mL/min (ref >60)
Glucose, Bld: 84 mg/dL (ref 70–99)
Potassium: 3.7 mmol/L (ref 3.5–5.1)
Sodium: 138 mmol/L (ref 135–145)

## 2020-06-06 LAB — I-STAT BETA HCG BLOOD, ED (MC, WL, AP ONLY): I-stat hCG, quantitative: <5 m[IU]/mL (ref <5)

## 2020-06-06 LAB — TROPONIN I (HIGH SENSITIVITY): Troponin I (High Sensitivity): 4 ng/L (ref <18)

## 2020-06-06 MED ORDER — NAPROXEN 500 MG PO TABS: 500.0000 mg | ORAL_TABLET | Freq: Two times a day (BID) | ORAL | 0 refills | Status: DC

## 2020-06-06 MED ORDER — KETOROLAC TROMETHAMINE 15 MG/ML IJ SOLN
15.0000 mg | Freq: Once | INTRAMUSCULAR | Status: AC
  Administered 2020-06-06: 13:00:00 15 mg via INTRAVENOUS
  Filled 2020-06-06: qty 1

## 2020-06-06 NOTE — ED Provider Notes (Signed)
[facility] DEPT Provider Note   CSN: [id number] Arrival date & time: 06/06/20  1287     History Chief Complaint  Patient presents with  . Chest Pain  . Numbness         Emma Harris is a 54 y.o. female presenting for evaluation of chest pain and arm numbness.  Patient states her abnormal began 4 days ago.  It is intermittent, only in the right arm between the elbow and the shoulder.  Is not precipitated by any movement.  When it comes on, last for 30 to 60 minutes and then resolves without intervention.  She denies numbness extending into her hand.  She is not dropping things.  No trauma or injury.  No new trauma or injury.  Patient states she feels her pillows are not supportive enough, she has been having some neck start soreness.  He has intermittent back spasms, more commonly on the left side over the trapezius.  These have not changed.  She is not currently having any numbness.  Additionally, patient presenting for evaluation of chest pain.  This began yesterday morning, had mid to left chest pain which she described as dull.  Lasted for several minutes before resolving without intervention.  She is chest pain-free all day yesterday, however returned again this morning.  She denies associated shortness of breath, diaphoresis, nausea, vomiting.  Denies recent fevers, chills, cough, abdominal pain, urinary symptoms, normal bowel movements.  She is not currently having chest pain.  Pain does not radiate.  She states it feels similar to other episodes she has had earlier this year.  Per chart review, this is mostly diagnosis MSK pain including costochondritis.  HPI     Past Medical History:  Diagnosis Date  . Acid reflux   . Hypertension     Patient Active Problem List   Diagnosis Date Noted  . Lower abdominal pain 03/05/2019  . Subclinical hypothyroidism 03/05/2019    Past Surgical History:  Procedure Laterality Date  . CHOLECYSTECTOMY   2006  . FRACTURE SURGERY Left    Knee  . umbilical surgery     as a child. patient unsure of procedure     OB History    Gravida  0   Para  0   Term  0   Preterm  0   AB  0   Living  0     SAB  0   IAB  0   Ectopic  0   Multiple  0   Live Births  0           Family History  Problem Relation Age of Onset  . Hypertension Mother     Social History   Tobacco Use  . Smoking status: Never Smoker  . Smokeless tobacco: Never Used  Vaping Use  . Vaping Use: Never used  Substance Use Topics  . Alcohol use: Yes    Comment: rare  . Drug use: No    Home Medications Prior to Admission medications   Medication Sig Start Date End Date Taking? Authorizing Provider  amLODipine (NORVASC) 5 MG tablet Take 5 mg by mouth daily. 05/28/18   [provider]  naproxen (NAPROSYN) 500 MG tablet Take 1 tablet (500 mg total) by mouth 2 (two) times daily with a meal. 06/06/20   Braeden Kennan, PA-C  omeprazole (PRILOSEC) 40 MG capsule Take 1 capsule (40 mg total) by mouth daily. 05/20/19   Ward, Delice Bison, DO  traMADol Veatrice Bourbon)  50 MG tablet Take 1 tablet (50 mg total) by mouth every 6 (six) hours as needed. 10/07/19   Veryl Speak, MD  promethazine (PHENERGAN) 25 MG suppository Place 1 suppository (25 mg total) rectally every 6 (six) hours as needed for nausea. Use in place of oral medication if you are vomiting Patient not taking: Reported on 10/06/2019 04/22/19 03/11/20  Wilber Oliphant, MD    Allergies    Shellfish allergy and Codeine  Review of Systems   Review of Systems  Cardiovascular: Positive for chest pain (intermittent, none currently).  Neurological: Positive for numbness (R upper arm, intermittent, none currently).  All other systems reviewed and are negative.   Physical Exam Updated Vital Signs BP 122/76   Pulse (!) 58   Temp 98 F (36.7 C) (Oral)   Resp 17   SpO2 100%   Physical Exam Vitals and nursing note reviewed.  Constitutional:       General: She is not in acute distress.    Appearance: She is well-developed and well-nourished.  HENT:     Head: Normocephalic and atraumatic.  Eyes:     Extraocular Movements: EOM normal.     Conjunctiva/sclera: Conjunctivae normal.     Pupils: Pupils are equal, round, and reactive to light.  Neck:     Comments: Full active range of motion of the head, mild discomfort when looking to the right Cardiovascular:     Rate and Rhythm: Normal rate and regular rhythm.     Pulses: Normal pulses and intact distal pulses.  Pulmonary:     Effort: Pulmonary effort is normal. No respiratory distress.     Breath sounds: Normal breath sounds. No wheezing.     Comments: ttp of chest wall along sternal borders, L>R.  Speaking in full sentences.  Clear lung sounds in all fields Chest:     Chest wall: Tenderness present.  Abdominal:     General: There is no distension.     Palpations: Abdomen is soft. There is no mass.     Tenderness: There is no abdominal tenderness. There is no guarding or rebound.  Musculoskeletal:        General: Normal range of motion.     Cervical back: Normal range of motion and neck supple.     Comments: Discomfort with palpation of the right trapezius muscle.  No obvious deformity.  Full active range of motion of the upper extremities without difficulty.  Grip strength equal bilaterally.  Radial pulses 2+ bilaterally.  Able to perform all finger movements without difficulty.  Skin:    General: Skin is warm and dry.     Capillary Refill: Capillary refill takes less than 2 seconds.     Findings: No rash.  Neurological:     Mental Status: She is alert and oriented to person, place, and time.  Psychiatric:        Mood and Affect: Mood and affect normal.     ED Results / Procedures / Treatments   Labs (all labs ordered are listed, but only abnormal results are displayed) Labs Reviewed  BASIC METABOLIC PANEL - Abnormal; Notable for the following components:      Result  Value   CO2 21 (*)    All other components within normal limits  CBC - Abnormal; Notable for the following components:   RBC 3.52 (*)    MCV 106.3 (*)    MCH 34.4 (*)    All other components within normal limits  I-STAT BETA HCG  BLOOD, ED ([facility], [facility], [facility] ONLY)  TROPONIN I (HIGH SENSITIVITY)    EKG EKG Interpretation  Date/Time:  Sunday June 06 2020 08:24:02 EST Ventricular Rate:  70 PR Interval:    QRS Duration: 79 QT Interval:  377 QTC Calculation: 407 R Axis:   49 Text Interpretation: Normal sinus rhythm Early transition Otherwise within normal limits Confirmed by Ray, Danielle ([id number]) on 06/06/2020 2:38:02 PM   Radiology DG Chest 2 View  Result Date: 06/06/2020 CLINICAL DATA:  54 year old female with history of intermittent right-sided arm numbness and chest pain for the past 2 days. EXAM: CHEST - 2 VIEW COMPARISON:  Chest x-ray 03/12/2020. FINDINGS: Lung volumes are normal. No consolidative airspace disease. No pleural effusions. No pneumothorax. No pulmonary nodule or mass noted. Pulmonary vasculature and the cardiomediastinal silhouette are within normal limits. Severe S shaped scoliosis of the thoracolumbar spine convex to the left in the upper thoracic region and to the right in the lower thoracic region. IMPRESSION: 1. No radiographic evidence of acute cardiopulmonary disease. Electronically Signed   By: Daniel  Entrikin M.D.   On: 06/06/2020 09:07    Procedures Procedures (including critical care time)  Medications Ordered in ED Medications  ketorolac (TORADOL) 15 MG/ML injection 15 mg (15 mg Intravenous Given 06/06/20 1232)    ED Course  I have reviewed the triage vital signs and the nursing notes.  Pertinent labs & imaging results that were available during my care of the patient were reviewed by me and considered in my medical decision making (see chart for details).    MDM Rules/Calculators/A&P                          Patient presenting for evaluation  of chest pain and right arm numbness.  Both are intermittent, not having currently.  On exam, patient appears nontoxic.  Right arm numbness is likely secondary to MSK/nerve impingement, as patient has tenderness of the trapezius and mild pain with looking to the right.  She has no midline C-spine tenderness, no trauma or injury.  I do not believe she needs emergent MRI or CT imaging of the neck.  Will treat with NSAIDs for inflammation and have patient follow-up with PCP. Additionally, patient presenting for chest pain.  On exam, chest wall is tender, mostly along the sternal borders.  She has had similar instances, but diagnosed with MSK in the past.  However, as patient is having left-sided chest pain, will obtain labs including troponin, EKG, chest x-ray.  If reassuring, plan for MSK treatment and follow-up with PCP.  Labs interpreted by me, overall reassuring.  Troponin negative.  EKG with ischemia.  Discussed findings with patient.  Discussed likely inflammation/MSK pain.  Discussed symptomatic treatment with NSAIDs and follow-up with PCP.  At this time, patient appears safe for discharge.  Return precautions given.  Patient states she understands and agrees to plan.   Final Clinical Impression(s) / ED Diagnoses Final diagnoses:  Atypical chest pain  Paresthesia    Rx / DC Orders ED Discharge Orders         Ordered    naproxen (NAPROSYN) 500 MG tablet  2 times daily with meals        12 /12/21 1435           Jalon Squier, PA-C 06/06/20 1444    Pattricia Boss, MD 06/06/20 2228

## 2020-06-06 NOTE — Discharge Instructions (Addendum)
Take naproxen 2 times a day with meals.  Do not take other anti-inflammatories at the same time (Advil, Motrin, ibuprofen, Aleve). You may supplement with Tylenol if you need further pain control. Continue taking her medications as prescribed. Follow-up with your primary care doctor as needed for recheck of your symptoms. Return to the emergency room develop severe worsening chest pain, difficulty breathing, persistent numbness in your arm that is causing you to drop things, or any new, worsening, or concerning symptoms.

## 2020-06-06 NOTE — ED Triage Notes (Signed)
Pt c/o intermittent right arm numbness that has been on going for 2 days. C/o left side chest pains that started today.

## 2020-12-28 ENCOUNTER — Other Ambulatory Visit: Payer: Self-pay | Admitting: Internal Medicine

## 2020-12-28 ENCOUNTER — Encounter (HOSPITAL_COMMUNITY): Payer: Self-pay

## 2020-12-28 ENCOUNTER — Ambulatory Visit (HOSPITAL_COMMUNITY)
Admission: RE | Admit: 2020-12-28 | Discharge: 2020-12-28 | Disposition: A | Discharge status: Home / Self Care | Payer: 59 | Source: Ambulatory Visit | Attending: Internal Medicine | Admitting: Internal Medicine

## 2020-12-28 ENCOUNTER — Other Ambulatory Visit: Payer: Self-pay

## 2020-12-28 ENCOUNTER — Other Ambulatory Visit (HOSPITAL_COMMUNITY): Payer: Self-pay | Admitting: Internal Medicine

## 2020-12-28 DIAGNOSIS — R1012 Left upper quadrant pain: Secondary | ICD-10-CM | POA: Insufficient documentation

## 2020-12-28 DIAGNOSIS — R1011 Right upper quadrant pain: Secondary | ICD-10-CM | POA: Diagnosis not present

## 2020-12-28 MED ORDER — IOHEXOL 300 MG/ML  SOLN
100.0000 mL | Freq: Once | INTRAMUSCULAR | Status: AC | PRN
  Administered 2020-12-28: 17:00:00 100 mL via INTRAVENOUS

## 2020-12-28 MED ORDER — SODIUM CHLORIDE (PF) 0.9 % IJ SOLN
INTRAMUSCULAR | Status: AC
  Filled 2020-12-28: qty 50

## 2021-01-10 ENCOUNTER — Other Ambulatory Visit: Payer: Self-pay

## 2021-01-10 ENCOUNTER — Encounter: Payer: Self-pay | Admitting: Family Medicine

## 2021-01-10 ENCOUNTER — Ambulatory Visit (INDEPENDENT_AMBULATORY_CARE_PROVIDER_SITE_OTHER): Payer: 59 | Admitting: Family Medicine

## 2021-01-10 DIAGNOSIS — M546 Pain in thoracic spine: Secondary | ICD-10-CM | POA: Diagnosis not present

## 2021-01-10 NOTE — Patient Instructions (Signed)
   Vitamin D3:  Take 5,000 IU daily

## 2021-01-10 NOTE — Progress Notes (Signed)
   Office Visit Note   Patient: Emma Harris           Date of Birth: 11-08-1965           MRN: [id number] Visit Date: 01/10/2021 Requested by: Sueanne Margarita, [address] [place],  [place] [id number] PCP: Sueanne Margarita, DO  Subjective: No chief complaint on file.   HPI: Here with right scapular pain for a year or so, no injury.  Intermittent pain, can "take her breath away".  Did about 6 sessions of PT a couple months ago with no relief.  Trying muscle relaxants with no relief.  No radicular symptoms.  History of scoliosis with intermittent pain since childhood.                 ROS:   All other systems were reviewed and are negative.  Objective: Vital Signs: There were no vitals taken for this visit.  Physical Exam:  General:  Alert and oriented, in no acute distress. Pulm:  Breathing unlabored. Psy:  Normal mood, congruent affect.  Back:  Scoliosis with right rib hump.  Tender trigger points along right scapula.  UE strength and reflexes are normal.    Imaging: No results found.  Assessment & Plan:  Chronic right rhomboid myofascial pain, probably related to scoliosis and vitamin D deficiency. - discussed with her and she would like to proceed with x-rays and MRI.  Will increase vitamin D3 to 5,000 IU daily.   - Could consider dry needling depending on MRI results.     Procedures: No procedures performed        PMFS History: Patient Active Problem List   Diagnosis Date Noted   Lower abdominal pain 03/05/2019   Subclinical hypothyroidism 03/05/2019   Past Medical History:  Diagnosis Date   Acid reflux    Hypertension     Family History  Problem Relation Age of Onset   Hypertension Mother     Past Surgical History:  Procedure Laterality Date   CHOLECYSTECTOMY  2006   FRACTURE SURGERY Left    Knee   umbilical surgery     as a child. patient unsure of procedure   Social History   Occupational History   Not on file  Tobacco Use   Smoking  status: Never   Smokeless tobacco: Never  Vaping Use   Vaping Use: Never used  Substance and Sexual Activity   Alcohol use: Yes    Comment: rare   Drug use: No   Sexual activity: Not on file

## 2021-01-24 ENCOUNTER — Other Ambulatory Visit: Payer: Self-pay

## 2021-01-24 ENCOUNTER — Ambulatory Visit (HOSPITAL_COMMUNITY)
Admission: RE | Admit: 2021-01-24 | Discharge: 2021-01-24 | Disposition: A | Discharge status: Home / Self Care | Payer: 59 | Source: Ambulatory Visit | Attending: Family Medicine | Admitting: Family Medicine

## 2021-01-24 DIAGNOSIS — M546 Pain in thoracic spine: Secondary | ICD-10-CM

## 2021-01-26 ENCOUNTER — Telehealth: Payer: Self-pay | Admitting: Family Medicine

## 2021-01-26 NOTE — Telephone Encounter (Signed)
MRI shows narrowing of the nerve openings on the right from T3 through T5, and on the left from T10 through T12, due to scoliosis.  No ruptured discs, no definite pinched nerves.  No indication for surgery.    For treatment I would recommend going back to PT for a trial of dry needling.  If that doesn't help, then possibly an epidural injection per Dr. Ernestina Patches.

## 2021-01-27 NOTE — Telephone Encounter (Signed)
I called and left voice message for the patient to call back concerning MRI results.

## 2021-01-27 NOTE — Telephone Encounter (Signed)
I called and advised the patient of her results. Sending her a copy of the MRI report and Dr. Junius Roads' results message. She will think about her treatment options and call us back with how she would like to proceed.

## 2021-02-11 ENCOUNTER — Telehealth: Payer: Self-pay | Admitting: Family Medicine

## 2021-02-11 DIAGNOSIS — M546 Pain in thoracic spine: Secondary | ICD-10-CM

## 2021-02-11 NOTE — Telephone Encounter (Signed)
Patient called. She would like a referral to have dry needle done. Her call back number is [phone]

## 2021-02-11 NOTE — Telephone Encounter (Signed)
I called and advised the patient the referral has been placed. She will receive a call from the PT facility to schedule an appointment.

## 2021-02-11 NOTE — Telephone Encounter (Signed)
See 01/26/21 phone conversation about MRI results.

## 2021-02-11 NOTE — Addendum Note (Signed)
Addended by: Hortencia Pilar on: 02/11/2021 03:09 PM   Modules accepted: Orders

## 2021-09-13 ENCOUNTER — Institutional Professional Consult (permissible substitution) (HOSPITAL_BASED_OUTPATIENT_CLINIC_OR_DEPARTMENT_OTHER): Payer: 59 | Admitting: Obstetrics & Gynecology

## 2021-10-18 ENCOUNTER — Ambulatory Visit (HOSPITAL_BASED_OUTPATIENT_CLINIC_OR_DEPARTMENT_OTHER): Payer: 59 | Admitting: Obstetrics & Gynecology

## 2021-10-18 ENCOUNTER — Encounter (HOSPITAL_BASED_OUTPATIENT_CLINIC_OR_DEPARTMENT_OTHER): Payer: Self-pay | Admitting: Obstetrics & Gynecology

## 2021-10-18 ENCOUNTER — Other Ambulatory Visit (HOSPITAL_COMMUNITY)
Admission: RE | Admit: 2021-10-18 | Discharge: 2021-10-18 | Disposition: A | Discharge status: Home / Self Care | Payer: 59 | Source: Ambulatory Visit | Attending: Obstetrics & Gynecology | Admitting: Obstetrics & Gynecology

## 2021-10-18 VITALS — BP 129/83 | HR 62 | Ht 60.0 in | Wt 137.2 lb

## 2021-10-18 DIAGNOSIS — Z01419 Encounter for gynecological examination (general) (routine) without abnormal findings: Secondary | ICD-10-CM | POA: Diagnosis not present

## 2021-10-18 DIAGNOSIS — I10 Essential (primary) hypertension: Secondary | ICD-10-CM

## 2021-10-18 DIAGNOSIS — N3281 Overactive bladder: Secondary | ICD-10-CM | POA: Diagnosis not present

## 2021-10-18 DIAGNOSIS — D250 Submucous leiomyoma of uterus: Secondary | ICD-10-CM

## 2021-10-18 DIAGNOSIS — Z124 Encounter for screening for malignant neoplasm of cervix: Secondary | ICD-10-CM | POA: Diagnosis not present

## 2021-10-18 DIAGNOSIS — D251 Intramural leiomyoma of uterus: Secondary | ICD-10-CM

## 2021-10-18 DIAGNOSIS — Z780 Asymptomatic menopausal state: Secondary | ICD-10-CM

## 2021-10-18 MED ORDER — MIRABEGRON ER 25 MG PO TB24: 25.0000 mg | ORAL_TABLET | Freq: Every day | ORAL | 1 refills | Status: DC

## 2021-10-18 NOTE — Progress Notes (Signed)
56 y.o. [place] female here for annual exam/new patient exam.  She was referred from Dr. Francesco Sor.  No vaginal bleeding for about 4 years.  H/o fibroids.  Does have urinary urgency.  She does sometimes leak when bladder seems fully.  She gets up 3-4 times a night.   ? ?No LMP recorded. Patient is perimenopausal.          ?Sexually active: No.  ?The current method of family planning is post menopausal status.    ?Smoker:  no ? ?Health Maintenance: ?Pap:  03/05/2019 Negative, neg HR HPV ?History of abnormal Pap:  remote hx, follow up was negative ?MMG:  2021 or 2022 ?Colonoscopy:  2022 with Dr. Collene Mares, follow up 5 years ?BMD:   recommended closer to age 43 ?Screening Labs: 2021 ? ? reports that she has never smoked. She has never used smokeless tobacco. She reports current alcohol use. She reports that she does not use drugs. ? ?Past Medical History:  ?Diagnosis Date  ? Acid reflux   ? Hypertension   ? ? ?Past Surgical History:  ?Procedure Laterality Date  ? CHOLECYSTECTOMY  2006  ? FRACTURE SURGERY Left   ? Knee  ? umbilical surgery    ? as a child. patient unsure of procedure  ? ? ?Current Outpatient Medications  ?Medication Sig Dispense Refill  ? amLODipine (NORVASC) 5 MG tablet Take 5 mg by mouth daily.  0  ? omeprazole (PRILOSEC) 40 MG capsule Take 1 capsule (40 mg total) by mouth daily. 30 capsule 1  ? ?No current facility-administered medications for this visit.  ? ? ?Family History  ?Problem Relation Age of Onset  ? Hypertension Mother   ? ? ?Review of Systems  ?All other systems reviewed and are negative. ? ?Exam:   ?BP 129/83 (BP Location: Right Arm, Patient Position: Sitting, Cuff Size: Normal)   Pulse 62   Ht 5' (1.524 m) Comment: reported  Wt 137 lb 3.2 oz (62.2 kg)   BMI 26.80 kg/m?   Height: 5' (152.4 cm) (reported) ? ?General appearance: alert, cooperative and appears stated age ?Head: Normocephalic, without obvious abnormality, atraumatic ?Neck: no adenopathy, supple,  symmetrical, trachea midline and thyroid normal to inspection and palpation ?Lungs: clear to auscultation bilaterally ?Breasts: normal appearance, no masses or tenderness ?Heart: regular rate and rhythm ?Abdomen: soft, non-tender; bowel sounds normal; no masses,  no organomegaly ?Extremities: extremities normal, atraumatic, no cyanosis or edema ?Skin: Skin color, texture, turgor normal. No rashes or lesions ?Lymph nodes: Cervical, supraclavicular, and axillary nodes normal. ?No abnormal inguinal nodes palpated ?Neurologic: Grossly normal ? ? ?Pelvic: External genitalia:  no lesions ?             Urethra:  normal appearing urethra with no masses, tenderness or lesions ?             Bartholins and Skenes: normal    ?             Vagina: normal appearing vagina with normal color and no discharge, no lesions ?             Cervix: no lesions ?             Pap taken: Yes.   ?Bimanual Exam:  Uterus:  normal size, contour, position, consistency, mobility, non-tender ?             Adnexa: normal adnexa ?              Rectovaginal:  Confirms ?              Anus:  normal sphincter tone, no lesions ? ?Chaperone, Octaviano Batty, CMA, was present for exam. ? ?Assessment/Plan: ?1. Well woman exam with routine gynecological exam ?- pap and HR HPV obtained today ?- MMG done at [facility].  Release signed today ?- colonoscopy 2022.  Release signed today ?- plan BMD closer to age 77 ?- lab work done with Dr. Francesco Sor ?- vaccines reviewed/updated ? ?2.  OAB (overactive bladder) ?- will try myrbetriq '25mg'$  daily.  Will recheck BP in 1 month.  Risks/side effects reviewed. ? ?4. Menopause ?- no HRT ? ?5. Intramural and submucous leiomyoma of uterus ? ?6. Primary hypertension ?- managed by Dr. Francesco Sor ? ? ?

## 2021-10-20 LAB — CYTOLOGY - PAP
Comment: NEGATIVE
Diagnosis: NEGATIVE
High risk HPV: NEGATIVE

## 2021-10-21 DIAGNOSIS — D250 Submucous leiomyoma of uterus: Secondary | ICD-10-CM | POA: Insufficient documentation

## 2021-10-21 DIAGNOSIS — N3281 Overactive bladder: Secondary | ICD-10-CM | POA: Insufficient documentation

## 2021-10-21 DIAGNOSIS — Z780 Asymptomatic menopausal state: Secondary | ICD-10-CM | POA: Insufficient documentation

## 2021-10-21 DIAGNOSIS — I10 Essential (primary) hypertension: Secondary | ICD-10-CM | POA: Insufficient documentation

## 2022-01-05 ENCOUNTER — Other Ambulatory Visit: Payer: Self-pay | Admitting: Internal Medicine

## 2022-01-05 ENCOUNTER — Ambulatory Visit
Admission: RE | Admit: 2022-01-05 | Discharge: 2022-01-05 | Disposition: A | Discharge status: Home / Self Care | Payer: Commercial Managed Care - HMO | Source: Ambulatory Visit | Attending: Internal Medicine | Admitting: Internal Medicine

## 2022-01-05 DIAGNOSIS — R109 Unspecified abdominal pain: Secondary | ICD-10-CM

## 2022-01-05 MED ORDER — IOPAMIDOL (ISOVUE-300) INJECTION 61%
100.0000 mL | Freq: Once | INTRAVENOUS | Status: AC | PRN
  Administered 2022-01-05: 100 mL via INTRAVENOUS

## 2022-01-07 ENCOUNTER — Encounter (HOSPITAL_BASED_OUTPATIENT_CLINIC_OR_DEPARTMENT_OTHER): Payer: Self-pay

## 2022-01-07 ENCOUNTER — Other Ambulatory Visit: Payer: Self-pay

## 2022-01-07 ENCOUNTER — Emergency Department (HOSPITAL_BASED_OUTPATIENT_CLINIC_OR_DEPARTMENT_OTHER)
Admission: EM | Admit: 2022-01-07 | Discharge: 2022-01-07 | Disposition: A | Discharge status: Home / Self Care | Payer: Commercial Managed Care - HMO | Attending: Emergency Medicine | Admitting: Emergency Medicine

## 2022-01-07 DIAGNOSIS — Z79899 Other long term (current) drug therapy: Secondary | ICD-10-CM | POA: Diagnosis not present

## 2022-01-07 DIAGNOSIS — K219 Gastro-esophageal reflux disease without esophagitis: Secondary | ICD-10-CM | POA: Diagnosis not present

## 2022-01-07 DIAGNOSIS — R1013 Epigastric pain: Secondary | ICD-10-CM | POA: Diagnosis present

## 2022-01-07 LAB — CBC WITH DIFFERENTIAL/PLATELET
Abs Immature Granulocytes: 0 10*3/uL (ref 0.00–0.07)
Basophils Absolute: 0 10*3/uL (ref 0.0–0.1)
Basophils Relative: 1 %
Eosinophils Absolute: 0.2 10*3/uL (ref 0.0–0.5)
Eosinophils Relative: 3 %
HCT: 34.7 % — ABNORMAL LOW (ref 36.0–46.0)
Hemoglobin: 11.7 g/dL — ABNORMAL LOW (ref 12.0–15.0)
Immature Granulocytes: 0 %
Lymphocytes Relative: 65 %
Lymphs Abs: 3.1 10*3/uL (ref 0.7–4.0)
MCH: 34.2 pg — ABNORMAL HIGH (ref 26.0–34.0)
MCHC: 33.7 g/dL (ref 30.0–36.0)
MCV: 101.5 fL — ABNORMAL HIGH (ref 80.0–100.0)
Monocytes Absolute: 0.4 10*3/uL (ref 0.1–1.0)
Monocytes Relative: 8 %
Neutro Abs: 1.1 10*3/uL — ABNORMAL LOW (ref 1.7–7.7)
Neutrophils Relative %: 23 %
Platelets: 274 10*3/uL (ref 150–400)
RBC: 3.42 MIL/uL — ABNORMAL LOW (ref 3.87–5.11)
RDW: 11.4 % — ABNORMAL LOW (ref 11.5–15.5)
WBC: 4.8 10*3/uL (ref 4.0–10.5)
nRBC: 0 % (ref 0.0–0.2)

## 2022-01-07 LAB — URINALYSIS, ROUTINE W REFLEX MICROSCOPIC
Bilirubin Urine: NEGATIVE
Glucose, UA: NEGATIVE mg/dL
Hgb urine dipstick: NEGATIVE
Ketones, ur: NEGATIVE mg/dL
Nitrite: NEGATIVE
Protein, ur: NEGATIVE mg/dL
Specific Gravity, Urine: 1.007 (ref 1.005–1.030)
pH: 6.5 (ref 5.0–8.0)

## 2022-01-07 LAB — COMPREHENSIVE METABOLIC PANEL
ALT: 16 U/L (ref 0–44)
AST: 24 U/L (ref 15–41)
Albumin: 4.4 g/dL (ref 3.5–5.0)
Alkaline Phosphatase: 81 U/L (ref 38–126)
Anion gap: 10 (ref 5–15)
BUN: 7 mg/dL (ref 6–20)
CO2: 23 mmol/L (ref 22–32)
Calcium: 10 mg/dL (ref 8.9–10.3)
Chloride: 107 mmol/L (ref 98–111)
Creatinine, Ser: 0.65 mg/dL (ref 0.44–1.00)
GFR, Estimated: >60 mL/min (ref >60)
Glucose, Bld: 87 mg/dL (ref 70–99)
Potassium: 3.6 mmol/L (ref 3.5–5.1)
Sodium: 140 mmol/L (ref 135–145)
Total Bilirubin: 0.9 mg/dL (ref 0.3–1.2)
Total Protein: 8 g/dL (ref 6.5–8.1)

## 2022-01-07 LAB — PREGNANCY, URINE: Preg Test, Ur: NEGATIVE

## 2022-01-07 LAB — LIPASE, BLOOD: Lipase: 44 U/L (ref 11–51)

## 2022-01-07 NOTE — ED Provider Notes (Signed)
[facility] EMERGENCY DEPT  Provider Note  CSN: [id number] Arrival date & time: 01/07/22 0409  History Chief Complaint  Patient presents with   Abdominal Pain    Emma Harris is a 56 y.o. female with history of GERD and constipation followed by Dr. Collene Mares, reports several days of intermittent epigastric and LUQ abdominal pain. Comes and goes without particular provoking factors, improved with Bentyl prescribed by PCP she saw earlier in the week. She was told her labs indicated 'leaking intestines'. She was sent for an outpatient CT but she has not seen those results yet. She denies any fever, vomiting, diarrhea or dysuria. She has had prior cholecystectomy.    Home Medications Prior to Admission medications   Medication Sig Start Date End Date Taking? Authorizing Provider  amLODipine (NORVASC) 5 MG tablet Take 5 mg by mouth daily. 05/28/18   [provider]  mirabegron ER (MYRBETRIQ) 25 MG TB24 tablet Take 1 tablet (25 mg total) by mouth daily. One po qd 10/18/21   Megan Salon, MD  omeprazole (PRILOSEC) 40 MG capsule Take 1 capsule (40 mg total) by mouth daily. 05/20/19   Ward, Delice Bison, DO  promethazine (PHENERGAN) 25 MG suppository Place 1 suppository (25 mg total) rectally every 6 (six) hours as needed for nausea. Use in place of oral medication if you are vomiting Patient not taking: Reported on 10/06/2019 04/22/19 03/11/20  Wilber Oliphant, MD     Allergies    Shellfish allergy and Codeine   Review of Systems   Review of Systems Please see HPI for pertinent positives and negatives  Physical Exam BP 139/80   Pulse (!) 55   Temp 98.1 F (36.7 C) (Oral)   Resp 15   Ht 5' (1.524 m)   Wt 60.8 kg   SpO2 100%   BMI 26.17 kg/m   Physical Exam Vitals and nursing note reviewed.  Constitutional:      Appearance: Normal appearance.  HENT:     Head: Normocephalic and atraumatic.     Nose: Nose normal.     Mouth/Throat:     Mouth: Mucous membranes  are moist.  Eyes:     Extraocular Movements: Extraocular movements intact.     Conjunctiva/sclera: Conjunctivae normal.  Cardiovascular:     Rate and Rhythm: Normal rate.  Pulmonary:     Effort: Pulmonary effort is normal.     Breath sounds: Normal breath sounds.  Abdominal:     General: Abdomen is flat.     Palpations: Abdomen is soft.     Tenderness: There is abdominal tenderness in the epigastric area and left upper quadrant. There is no guarding. Negative signs include Murphy's sign and McBurney's sign.  Musculoskeletal:        General: No swelling. Normal range of motion.     Cervical back: Neck supple.  Skin:    General: Skin is warm and dry.  Neurological:     General: No focal deficit present.     Mental Status: She is alert.  Psychiatric:        Mood and Affect: Mood normal.     ED Results / Procedures / Treatments   EKG EKG Interpretation  Date/Time:  Saturday January 07 2022 04:16:22 EDT Ventricular Rate:  66 PR Interval:  124 QRS Duration: 90 QT Interval:  390 QTC Calculation: 409 R Axis:   8 Text Interpretation: Sinus rhythm Low voltage, precordial leads Abnormal R-wave progression, early transition Nonspecific T abnormalities, anterior leads No significant  change since last tracing Confirmed by Calvert Cantor [phone]) on 01/07/2022 4:23:46 AM  Procedures Procedures  Medications Ordered in the ED Medications - No data to display  Initial Impression and Plan  Patient here with epigastric/LUQ abdominal pain. She had outpatient labs which I do not have access to. I personally viewed the images from radiology studies and agree with radiologist interpretation: CT done 7/13 showed unchanged uterine fibroids and no other acute process. Will check labs today to ensure no signs of liver or pancreas issues. Her pain is minimal at the time of my evaluation.    ED Course   Clinical Course as of 01/07/22 0552  Sat Jan 07, 2022  0451 CMP and lipase are normal.  [CS]   9767 UA with borderline signs of infection but patient denies any urinary symptoms today. Her pain is well controlled. Recommend outpatient follow up with GI for consideration of EGD for further evaluation. RTED for any other concerns.  [CS]    Clinical Course User Index [CS] Truddie Hidden, MD     MDM Rules/Calculators/A&P Medical Decision Making Problems Addressed: Epigastric pain: acute illness or injury  Amount and/or Complexity of Data Reviewed Labs: ordered. Decision-making details documented in ED Course.    Final Clinical Impression(s) / ED Diagnoses Final diagnoses:  Epigastric pain    Rx / DC Orders ED Discharge Orders     None        Truddie Hidden, MD 01/07/22 [phone]

## 2022-01-07 NOTE — ED Triage Notes (Signed)
Left upper abdominal pain since this past Wednesday.  Waiting results of CT that was done this past Thursday.

## 2022-01-07 NOTE — ED Notes (Signed)
Pt verbalizes understanding of discharge instructions. Opportunity for questioning and answers were provided. Pt discharged from ED to home.   ? ?

## 2022-03-06 ENCOUNTER — Encounter (HOSPITAL_BASED_OUTPATIENT_CLINIC_OR_DEPARTMENT_OTHER): Payer: Self-pay | Admitting: *Deleted

## 2022-03-06 ENCOUNTER — Other Ambulatory Visit: Payer: Self-pay

## 2022-03-06 ENCOUNTER — Emergency Department (HOSPITAL_BASED_OUTPATIENT_CLINIC_OR_DEPARTMENT_OTHER): Payer: Commercial Managed Care - HMO

## 2022-03-06 ENCOUNTER — Emergency Department (HOSPITAL_BASED_OUTPATIENT_CLINIC_OR_DEPARTMENT_OTHER)
Admission: EM | Admit: 2022-03-06 | Discharge: 2022-03-06 | Disposition: A | Discharge status: Home / Self Care | Payer: Commercial Managed Care - HMO | Attending: Emergency Medicine | Admitting: Emergency Medicine

## 2022-03-06 DIAGNOSIS — Z79899 Other long term (current) drug therapy: Secondary | ICD-10-CM | POA: Insufficient documentation

## 2022-03-06 DIAGNOSIS — R1032 Left lower quadrant pain: Secondary | ICD-10-CM | POA: Insufficient documentation

## 2022-03-06 DIAGNOSIS — I10 Essential (primary) hypertension: Secondary | ICD-10-CM | POA: Diagnosis not present

## 2022-03-06 DIAGNOSIS — R110 Nausea: Secondary | ICD-10-CM | POA: Diagnosis not present

## 2022-03-06 DIAGNOSIS — R109 Unspecified abdominal pain: Secondary | ICD-10-CM

## 2022-03-06 LAB — URINALYSIS, ROUTINE W REFLEX MICROSCOPIC
Bilirubin Urine: NEGATIVE
Glucose, UA: NEGATIVE mg/dL
Hgb urine dipstick: NEGATIVE
Ketones, ur: NEGATIVE mg/dL
Nitrite: NEGATIVE
Protein, ur: NEGATIVE mg/dL
Specific Gravity, Urine: 1.013 (ref 1.005–1.030)
pH: 7.5 (ref 5.0–8.0)

## 2022-03-06 LAB — CBC WITH DIFFERENTIAL/PLATELET
Abs Immature Granulocytes: 0.01 10*3/uL (ref 0.00–0.07)
Basophils Absolute: 0 10*3/uL (ref 0.0–0.1)
Basophils Relative: 1 %
Eosinophils Absolute: 0.2 10*3/uL (ref 0.0–0.5)
Eosinophils Relative: 3 %
HCT: 38.7 % (ref 36.0–46.0)
Hemoglobin: 12.8 g/dL (ref 12.0–15.0)
Immature Granulocytes: 0 %
Lymphocytes Relative: 66 %
Lymphs Abs: 3.5 10*3/uL (ref 0.7–4.0)
MCH: 33.5 pg (ref 26.0–34.0)
MCHC: 33.1 g/dL (ref 30.0–36.0)
MCV: 101.3 fL — ABNORMAL HIGH (ref 80.0–100.0)
Monocytes Absolute: 0.3 10*3/uL (ref 0.1–1.0)
Monocytes Relative: 6 %
Neutro Abs: 1.3 10*3/uL — ABNORMAL LOW (ref 1.7–7.7)
Neutrophils Relative %: 24 %
Platelets: 251 10*3/uL (ref 150–400)
RBC: 3.82 MIL/uL — ABNORMAL LOW (ref 3.87–5.11)
RDW: 11.7 % (ref 11.5–15.5)
WBC: 5.3 10*3/uL (ref 4.0–10.5)
nRBC: 0 % (ref 0.0–0.2)

## 2022-03-06 LAB — COMPREHENSIVE METABOLIC PANEL
ALT: 17 U/L (ref 0–44)
AST: 21 U/L (ref 15–41)
Albumin: 4.5 g/dL (ref 3.5–5.0)
Alkaline Phosphatase: 82 U/L (ref 38–126)
Anion gap: 7 (ref 5–15)
BUN: 12 mg/dL (ref 6–20)
CO2: 26 mmol/L (ref 22–32)
Calcium: 10.5 mg/dL — ABNORMAL HIGH (ref 8.9–10.3)
Chloride: 105 mmol/L (ref 98–111)
Creatinine, Ser: 0.72 mg/dL (ref 0.44–1.00)
GFR, Estimated: >60 mL/min (ref >60)
Glucose, Bld: 83 mg/dL (ref 70–99)
Potassium: 3.5 mmol/L (ref 3.5–5.1)
Sodium: 138 mmol/L (ref 135–145)
Total Bilirubin: 0.6 mg/dL (ref 0.3–1.2)
Total Protein: 8.3 g/dL — ABNORMAL HIGH (ref 6.5–8.1)

## 2022-03-06 LAB — LIPASE, BLOOD: Lipase: 44 U/L (ref 11–51)

## 2022-03-06 MED ORDER — ALUM & MAG HYDROXIDE-SIMETH 200-200-20 MG/5ML PO SUSP
30.0000 mL | Freq: Once | ORAL | Status: AC
  Administered 2022-03-06: 30 mL via ORAL
  Filled 2022-03-06: qty 30

## 2022-03-06 MED ORDER — MORPHINE SULFATE (PF) 4 MG/ML IV SOLN
4.0000 mg | Freq: Once | INTRAVENOUS | Status: AC
  Administered 2022-03-06: 4 mg via INTRAVENOUS
  Filled 2022-03-06: qty 1

## 2022-03-06 MED ORDER — ONDANSETRON HCL 4 MG/2ML IJ SOLN
4.0000 mg | Freq: Once | INTRAMUSCULAR | Status: AC
Start: 2022-03-06 — End: 2022-03-06
  Administered 2022-03-06: 4 mg via INTRAVENOUS
  Filled 2022-03-06: qty 2

## 2022-03-06 NOTE — ED Provider Notes (Signed)
[facility] EM[facility] DEPT Provider Note   CSN: [id number] Arrival date & time: 03/06/22  0319     History  Chief Complaint  Patient presents with   Abdominal Pain    Emma Harris is a 56 y.o. female.  HPI     This is a 56 year old female who presents with abdominal and flank pain.  Patient reports onset of left-sided back and abdominal pain that radiates towards the right.  Pain comes and goes.  It has been ongoing for the last 2 to 3 days.  She has had similar symptoms in the past and states that her GI doctor told her it was related to constipation.  She reports some association with food and states that "eating makes me sick."  She has not had any vomiting but has had nausea.  She also reports that she has significant pain with certain movements.  She has not taken anything for her symptoms.  Reports normal bowel movements.  Home Medications Prior to Admission medications   Medication Sig Start Date End Date Taking? Authorizing Provider  amLODipine (NORVASC) 5 MG tablet Take 5 mg by mouth daily. 05/28/18   [provider]  dicyclomine (BENTYL) 10 MG capsule Take 10-20 mg by mouth 3 (three) times daily as needed. 01/05/22   [provider]  omeprazole (PRILOSEC) 40 MG capsule Take 1 capsule (40 mg total) by mouth daily. 05/20/19   Ward, Delice Bison, DO  promethazine (PHENERGAN) 25 MG suppository Place 1 suppository (25 mg total) rectally every 6 (six) hours as needed for nausea. Use in place of oral medication if you are vomiting Patient not taking: Reported on 10/06/2019 04/22/19 03/11/20  Wilber Oliphant, MD      Allergies    Shellfish allergy and Codeine    Review of Systems   Review of Systems  Constitutional:  Negative for fever.  Gastrointestinal:  Positive for abdominal pain.  Genitourinary:  Positive for flank pain.  All other systems reviewed and are negative.   Physical Exam Updated Vital Signs BP 122/80   Pulse (!) 57   Temp (!)  97.5 F (36.4 C) (Oral)   Resp 18   Ht 1.524 m (5')   Wt 61.2 kg   LMP 02/17/2016   SpO2 98%   BMI 26.37 kg/m  Physical Exam Vitals and nursing note reviewed.  Constitutional:      Appearance: She is well-developed. She is not ill-appearing.  HENT:     Head: Normocephalic and atraumatic.  Eyes:     Pupils: Pupils are equal, round, and reactive to light.  Cardiovascular:     Rate and Rhythm: Normal rate and regular rhythm.     Heart sounds: Normal heart sounds.  Pulmonary:     Effort: Pulmonary effort is normal. No respiratory distress.     Breath sounds: No wheezing.  Abdominal:     General: Bowel sounds are normal.     Palpations: Abdomen is soft.     Tenderness: There is generalized abdominal tenderness and tenderness in the epigastric area and left upper quadrant. There is no right CVA tenderness or left CVA tenderness.  Musculoskeletal:     Cervical back: Neck supple.  Skin:    General: Skin is warm and dry.  Neurological:     General: No focal deficit present.     Mental Status: She is alert and oriented to person, place, and time.     ED Results / Procedures / Treatments   Labs (all labs  ordered are listed, but only abnormal results are displayed) Labs Reviewed  CBC WITH DIFFERENTIAL/PLATELET - Abnormal; Notable for the following components:      Result Value   RBC 3.82 (*)    MCV 101.3 (*)    Neutro Abs 1.3 (*)    All other components within normal limits  COMPREHENSIVE METABOLIC PANEL - Abnormal; Notable for the following components:   Calcium 10.5 (*)    Total Protein 8.3 (*)    All other components within normal limits  URINALYSIS, ROUTINE W REFLEX MICROSCOPIC - Abnormal; Notable for the following components:   Leukocytes,Ua TRACE (*)    All other components within normal limits  LIPASE, BLOOD    EKG None  Radiology CT Renal Stone Study  Result Date: 03/06/2022 CLINICAL DATA:  Bilateral flank and lower abdominal pain. EXAM: CT ABDOMEN AND  PELVIS WITHOUT CONTRAST TECHNIQUE: Multidetector CT imaging of the abdomen and pelvis was performed following the standard protocol without IV contrast. RADIATION DOSE REDUCTION: This exam was performed according to the departmental dose-optimization program which includes automated exposure control, adjustment of the mA and/or kV according to patient size and/or use of iterative reconstruction technique. COMPARISON:  01/05/2022 FINDINGS: Lower chest: Unremarkable. Hepatobiliary: No suspicious focal abnormality within the liver parenchyma. Gallbladder surgically absent. Pneumobilia suggests prior sphincterotomy. Pancreas: No focal mass lesion. No dilatation of the main duct. No intraparenchymal cyst. No peripancreatic edema. Spleen: No splenomegaly. No focal mass lesion. Adrenals/Urinary Tract: No adrenal nodule or mass. Kidneys unremarkable. No evidence for hydroureter. The urinary bladder appears normal for the degree of distention. Stomach/Bowel: Stomach is unremarkable. No gastric wall thickening. No evidence of outlet obstruction. Duodenum is normally positioned as is the ligament of Treitz. No small bowel wall thickening. No small bowel dilatation. The appendix is not well visualized, but there is no edema or inflammation in the region of the cecum. No gross colonic mass. No colonic wall thickening. Vascular/Lymphatic: No abdominal aortic aneurysm. No abdominal aortic atherosclerotic calcification. There is no gastrohepatic or hepatoduodenal ligament lymphadenopathy. No retroperitoneal or mesenteric lymphadenopathy. No pelvic sidewall lymphadenopathy. Reproductive: Uterine fibroids again noted. There is no adnexal mass. Other: No intraperitoneal free fluid. Musculoskeletal: No worrisome lytic or sclerotic osseous abnormality. IMPRESSION: 1. No acute findings in the abdomen or pelvis. Specifically, no findings to explain the patient's history of bilateral flank pain. 2. Uterine fibroids. Electronically Signed    By: Misty Stanley M.D.   On: 03/06/2022 05:27    Procedures Procedures    Medications Ordered in ED Medications  morphine (PF) 4 MG/ML injection 4 mg (4 mg Intravenous Given 03/06/22 0514)  ondansetron (ZOFRAN) injection 4 mg (4 mg Intravenous Given 03/06/22 0515)  alum & mag hydroxide-simeth (MAALOX/MYLANTA) 200-200-20 MG/5ML suspension 30 mL (30 mLs Oral Given 03/06/22 0541)    ED Course/ Medical Decision Making/ A&P                           Medical Decision Making Amount and/or Complexity of Data Reviewed Labs: ordered. Radiology: ordered.  Risk OTC drugs. Prescription drug management.   This patient presents to the ED for concern of left flank and abdominal pain, this involves an extensive number of treatment options, and is a complaint that carries with it a high risk of complications and morbidity.  I considered the following differential and admission for this acute, potentially life threatening condition.  The differential diagnosis includes UTI, kidney stone, colitis, diverticulitis, SBO, shingles although no  rash present  MDM:    This is a 56 year old female who presents with left flank and abdominal pain.  She is nontoxic and vital signs are reassuring.  She has pain that waxes and wanes.  There are some components that seem musculoskeletal although she does have some components that are related to food.  She does not have any urinary symptoms.  Labs obtained.  Patient given pain and nausea medication.  Labs are largely unremarkable including CBC, CMP, lipase, urinalysis.  CT stone study obtained and shows no evidence of kidney stone.  Unclear etiology of the patient's symptoms.  Given pain with certain movements and tenderness to palpation on exam, could be muscular in origin.  We will trial a course of anti-inflammatories.  (Labs, imaging, consults)  Labs: I Ordered, and personally interpreted labs.  The pertinent results include: CBC, CMP, lipase, urinalysis  Imaging  Studies ordered: I ordered imaging studies including CT stone study I independently visualized and interpreted imaging. I agree with the radiologist interpretation  Additional history obtained from chart review.  External records from outside source obtained and reviewed including prior evaluations  Cardiac Monitoring: The patient was maintained on a cardiac monitor.  I personally viewed and interpreted the cardiac monitored which showed an underlying rhythm of: Normal sinus rhythm  Reevaluation: After the interventions noted above, I reevaluated the patient and found that they have :stayed the same  Social Determinants of Health: Lives independently  Disposition: Discharge  Co morbidities that complicate the patient evaluation  Past Medical History:  Diagnosis Date   Acid reflux    Hypertension      Medicines Meds ordered this encounter  Medications   morphine (PF) 4 MG/ML injection 4 mg   ondansetron (ZOFRAN) injection 4 mg   alum & mag hydroxide-simeth (MAALOX/MYLANTA) 200-200-20 MG/5ML suspension 30 mL    I have reviewed the patients home medicines and have made adjustments as needed  Problem List / ED Course: Problem List Items Addressed This Visit   None Visit Diagnoses     Left flank pain    -  Primary                   Final Clinical Impression(s) / ED Diagnoses Final diagnoses:  Left flank pain    Rx / DC Orders ED Discharge Orders     None         Merryl Hacker, MD 03/06/22 [phone]

## 2022-03-06 NOTE — Discharge Instructions (Signed)
You were seen today for left-sided back pain.  Your work-up is reassuring.  Take ibuprofen as needed for pain.  Follow-up with your GI doctor and primary physician if you have ongoing symptoms.

## 2022-03-06 NOTE — ED Triage Notes (Addendum)
C/o right flank pain and lower bilateral abd pain that started a few days ago. Denies any fevers. Denies any urinary symptoms. Has not taken anything for pain. Sates pain comes and goes. States hx of same.

## 2022-03-07 ENCOUNTER — Telehealth (HOSPITAL_BASED_OUTPATIENT_CLINIC_OR_DEPARTMENT_OTHER): Payer: Self-pay | Admitting: Obstetrics & Gynecology

## 2022-03-07 NOTE — Telephone Encounter (Signed)
Patient called would like for the nurse to please call and she would like an order as well transvaginal ultrasoundule down stairs to see if her Fibroids  gotten bigger .

## 2022-03-21 NOTE — Telephone Encounter (Signed)
Pt requests ultrasound to evaluate fibroids. Pt has appt but it needs to be cancelled as there is no sonographer in office that day. Advised that order could be changed to be done at imaging at [facility].

## 2022-03-22 ENCOUNTER — Other Ambulatory Visit (HOSPITAL_BASED_OUTPATIENT_CLINIC_OR_DEPARTMENT_OTHER): Payer: Self-pay | Admitting: Obstetrics & Gynecology

## 2022-03-22 DIAGNOSIS — D250 Submucous leiomyoma of uterus: Secondary | ICD-10-CM

## 2022-03-29 ENCOUNTER — Other Ambulatory Visit (HOSPITAL_BASED_OUTPATIENT_CLINIC_OR_DEPARTMENT_OTHER): Payer: Commercial Managed Care - HMO

## 2022-03-29 ENCOUNTER — Encounter (HOSPITAL_BASED_OUTPATIENT_CLINIC_OR_DEPARTMENT_OTHER): Payer: Self-pay

## 2022-03-29 ENCOUNTER — Ambulatory Visit (HOSPITAL_BASED_OUTPATIENT_CLINIC_OR_DEPARTMENT_OTHER): Payer: Commercial Managed Care - HMO | Admitting: Obstetrics & Gynecology

## 2022-03-31 ENCOUNTER — Ambulatory Visit (HOSPITAL_BASED_OUTPATIENT_CLINIC_OR_DEPARTMENT_OTHER)
Admission: RE | Admit: 2022-03-31 | Discharge: 2022-03-31 | Disposition: A | Discharge status: Home / Self Care | Payer: BLUE CROSS/BLUE SHIELD | Source: Ambulatory Visit | Attending: Obstetrics & Gynecology | Admitting: Obstetrics & Gynecology

## 2022-03-31 DIAGNOSIS — D250 Submucous leiomyoma of uterus: Secondary | ICD-10-CM | POA: Insufficient documentation

## 2022-03-31 DIAGNOSIS — D251 Intramural leiomyoma of uterus: Secondary | ICD-10-CM | POA: Insufficient documentation

## 2022-04-04 ENCOUNTER — Ambulatory Visit (HOSPITAL_BASED_OUTPATIENT_CLINIC_OR_DEPARTMENT_OTHER): Payer: Commercial Managed Care - HMO | Admitting: Obstetrics & Gynecology

## 2022-04-05 ENCOUNTER — Telehealth: Payer: Self-pay | Admitting: Gastroenterology

## 2022-04-05 NOTE — Telephone Encounter (Signed)
Received previous GI records called patient to advise and ask reason for transfer and provider. Left voicemail.

## 2022-04-26 ENCOUNTER — Ambulatory Visit: Payer: BLUE CROSS/BLUE SHIELD | Admitting: Sports Medicine

## 2022-05-30 NOTE — Telephone Encounter (Signed)
Called patient again to follow up left voicemail records will be shredded.

## 2022-06-02 ENCOUNTER — Telehealth: Payer: Self-pay | Admitting: Gastroenterology

## 2022-06-02 NOTE — Telephone Encounter (Signed)
Good Afternoon Dr Tarri Glenn  Supervising MD 06/02/22 AM  We have received a request from patient wanting to establish care due to not being satisfied with [facility] gastro.  Patient records are available for review in epic. Please review and advise on scheduling.  Thank you

## 2022-06-08 ENCOUNTER — Telehealth (HOSPITAL_BASED_OUTPATIENT_CLINIC_OR_DEPARTMENT_OTHER): Payer: Self-pay | Admitting: Obstetrics & Gynecology

## 2022-06-08 NOTE — Telephone Encounter (Signed)
Patient called and would for some to please call her go over her lab results .

## 2022-08-29 NOTE — Telephone Encounter (Signed)
PT is calling to get an update on if she can be accepted as a new patient.

## 2022-10-03 NOTE — Telephone Encounter (Signed)
Patient called to follow up and submit another transfer of care request. Please advise.

## 2022-10-04 ENCOUNTER — Encounter: Payer: Self-pay | Admitting: Nurse Practitioner

## 2022-11-30 ENCOUNTER — Encounter: Payer: Self-pay | Admitting: Nurse Practitioner

## 2022-11-30 ENCOUNTER — Ambulatory Visit: Payer: BLUE CROSS/BLUE SHIELD | Admitting: Nurse Practitioner

## 2022-11-30 VITALS — BP 100/66 | HR 88 | Ht 60.0 in | Wt 132.0 lb

## 2022-11-30 DIAGNOSIS — Z86010 Personal history of colonic polyps: Secondary | ICD-10-CM

## 2022-11-30 DIAGNOSIS — K219 Gastro-esophageal reflux disease without esophagitis: Secondary | ICD-10-CM

## 2022-11-30 MED ORDER — OMEPRAZOLE 40 MG PO CPDR: 40.0000 mg | DELAYED_RELEASE_CAPSULE | Freq: Every day | ORAL | 3 refills | Status: DC

## 2022-11-30 NOTE — Patient Instructions (Addendum)
_______________________________________________________  If your blood pressure at your visit was 140/90 or greater, please contact your primary care physician to follow up on this.  If you are age 56 or younger, your body mass index should be between 19-25. Your Body mass index is 25.78 kg/m. If this is out of the aformentioned range listed, please consider follow up with your Primary Care Provider.  ________________________________________________________  The [facility] providers would like to encourage you to use [facility] to communicate with providers for non-urgent requests or questions.  Due to long hold times on the telephone, sending your provider a message by [facility] may be a faster and more efficient way to get a response.  Please allow 48 business hours for a response.  Please remember that this is for non-urgent requests.  _______________________________________________________  We have sent the following medications to your pharmacy for you to pick up at your convenience:  CONTINUE: Omeprazole 40 mg one capsule daily prior to dinner meal each day.  We will contact you regarding getting your next colonoscopy schedule.   Thank you for entrusting me with your care and choosing [facility]  Willette Cluster, NP

## 2022-11-30 NOTE — Progress Notes (Signed)
Assessment / Plan   Primary GI:  New- Emma Jupiter, MD  History of colon polyps.  Had a 5 mm tubular adenoma removed from the descending colon December 2020 [facility]).   Patient was told she needed another colonoscopy in about 3 years.   -Based on the current polyp surveillance guidelines she meets criteria for a 7 year surveillance colonoscopy . However, I will defer to Dr. Myrtie Harris as he may want to repeat at 5 years.  She has no bowel changes or blood in stool but gives a history of a tumor on the "outside" of her sisters colon.  I am unable to confirm that it was an extracolonic mass as no details are available  GERD without Barrett's esophagus.  Asymptomatic on daily omeprazole.  -Discussed anti-reflux measures such as avoidance of late meals / bedtime snacks, HOB elevation (or use of wedge pillow), weight reduction ( if applicable)  / maintaining a healthy BMI ( body mass index),  and avoidance of trigger foods and caffeine.  -Will refill omeprazole at patient's request.  She will take it 30 minutes before dinner  Lactose intolerance.    History of Present Illness   Chief Complaint: History of colon polyps.   57 y.o. yo female with a past medical history consisting of, but not necessarily limited to adenomatous colon polyps, GERD, cholecystectomy uterine fibroids.   Patient is ? Self -referred for history of colon polyps.  She was previously seen by Emma Harris and also GI with Emma Harris.  Emma Harris had excepted the transfer of care.   Emma Harris gives a history of colon polyps.  She tells me that her sister had a tumor around her colon (extracolonic) but she does not know if it was malignant  We called Emma Harris office and we were able to have her  February 2019 screening colonoscopy report faxed over.  It was normal.   Other endoscopic evaluations reviewed in Care Everywhere   Emma Harris then changed GI care to Emma Harris. I reviewed notes in care everywhere.  She  was seen there in late 2020 for nausea, weight loss.  She had an upper endoscopy.  According to their office notes the EGD was normal.  Then in December 2020 she had a repeat colonoscopy at [facility] ( apparently also as part of the workup for weight loss).  A small tubular adenoma was removed.  The prep was good.   Patient is here because she was told that she would need another colonoscopy in 3 to 5 years .  She did not want to return to [facility] .   Emma Harris has not had any bowel changes except that she tends to get constipated after eating bread.  She has not had any blood in her stool.  Her weight is stable.  She periodically gets abdominal pain if she eats bread.    She also gives a history of GERD.  Currently asymptomatic on 40 mg of omeprazole every day.  Her GERD symptoms in the past have consisted of burning in her chest and throat.  She goes to bed on an empty stomach and sleeps on a wedge pillow.   Previous Endoscopies / Labs /  Imaging   07/2017 screening colonoscopy. Emma Harris -The exam was complete, the prep was adequate.  The entire colon was normal.  The examined portion of the ileum was normal.  No specimens collected  September 2020 solid gastric emptying, 4 hours.  -  Normal study  December 2020 colonoscopy- Dr. Octaviano Harris -The prep was good.  A 5 cm sessile polyp was removed from the descending colon.  A 2 mm sessile polyp was removed from the sigmoid colon.  Medium internal hemorrhoids seen on direct view.  Retroflexion was not done due to small vault. Path-descending colon polyp was a tubular adenoma.  The sigmoid colon polyp was hyperplastic      Latest Ref Rng & Units 03/06/2022    3:56 AM 01/07/2022    4:19 AM 06/06/2020    8:24 AM  CBC  WBC 4.0 - 10.5 K/uL 5.3  4.8  5.0   Hemoglobin 12.0 - 15.0 g/dL 16.1  09.6  04.5   Hematocrit 36.0 - 46.0 % 38.7  34.7  37.4   Platelets 150 - 400 K/uL 251  274  278     Lab Results  Component Value Date   LIPASE  44 03/06/2022      Latest Ref Rng & Units 03/06/2022    3:56 AM 01/07/2022    4:19 AM 06/06/2020    8:24 AM  CMP  Glucose 70 - 99 mg/dL 83  87  84   BUN 6 - 20 mg/dL 12  7  11    Creatinine 0.44 - 1.00 mg/dL 4.09  8.11  9.14   Sodium 135 - 145 mmol/L 138  140  138   Potassium 3.5 - 5.1 mmol/L 3.5  3.6  3.7   Chloride 98 - 111 mmol/L 105  107  107   CO2 22 - 32 mmol/L 26  23  21    Calcium 8.9 - 10.3 mg/dL 78.2  95.6  9.5   Total Protein 6.5 - 8.1 g/dL 8.3  8.0    Total Bilirubin 0.3 - 1.2 mg/dL 0.6  0.9    Alkaline Phos 38 - 126 U/L 82  81    AST 15 - 41 U/L 21  24    ALT 0 - 44 U/L 17  16      Past Medical History:  Diagnosis Date   Acid reflux    Hypertension    Past Surgical History:  Procedure Laterality Date   CHOLECYSTECTOMY  2006   FRACTURE SURGERY Left    Knee   KNEE SURGERY     UMBILICAL HERNIA REPAIR     as a child. patient unsure of procedure   Family History  Problem Relation Age of Onset   Hypertension Mother    Social History   Tobacco Use   Smoking status: Never   Smokeless tobacco: Never  Vaping Use   Vaping Use: Never used  Substance Use Topics   Alcohol use: Not Currently    Comment: rare   Drug use: No   Current Outpatient Medications  Medication Sig Dispense Refill   dicyclomine (BENTYL) 10 MG capsule Take 10-20 mg by mouth 3 (three) times daily as needed.     omeprazole (PRILOSEC) 40 MG capsule Take 1 capsule (40 mg total) by mouth daily. 30 capsule 1   No current facility-administered medications for this visit.   Allergies  Allergen Reactions   Shellfish Allergy Anaphylaxis   Codeine Other (See Comments)    Pt states that this medication makes her "crazy".      Review of Systems: All systems reviewed and negative except where noted in HPI.   Wt Readings from Last 3 Encounters:  11/30/22 132 lb (59.9 kg)  03/06/22 135 lb (61.2 kg)  01/07/22 134 lb (60.8 kg)  Physical Exam:  BP 100/66   Pulse 88   Ht 5' (1.524 m)   Wt  132 lb (59.9 kg)   LMP 02/17/2016   BMI 25.78 kg/m  Constitutional:  Pleasant, generally well appearing female in no acute distress. Psychiatric:  Normal mood and affect. Behavior is normal. EENT: Pupils normal.  Conjunctivae are normal. No scleral icterus. Neck supple.  Cardiovascular: Normal rate, regular rhythm.  Pulmonary/chest: Effort normal and breath sounds normal. No wheezing, rales or rhonchi. Abdominal: Soft, nondistended, nontender. Bowel sounds active throughout. There are no masses palpable. No hepatomegaly. Neurological: Alert and oriented to person place and time. Skin: Skin is warm and dry. No rashes noted.  Willette Cluster, NP  11/30/2022, 3:16 PM

## 2022-12-01 NOTE — Progress Notes (Signed)
____________________________________________________________  Attending physician addendum:  Thank you for sending this case to me. I have reviewed the entire note and agree with the plan.  I suspect she was given a 3-5 year recall recommendation because a TA was discovered on the 2020 exam with no polyps reported in 2019.   While unknown if the 2020 polyp was de novo or missed in 2019, that recommendation makes sense based on the records we have. She should be offered a colonoscopy any time now.  Amada Jupiter, MD  ____________________________________________________________

## 2022-12-05 ENCOUNTER — Telehealth: Payer: Self-pay | Admitting: Nurse Practitioner

## 2022-12-05 MED ORDER — OMEPRAZOLE 40 MG PO CPDR: 40.0000 mg | DELAYED_RELEASE_CAPSULE | Freq: Every day | ORAL | 3 refills | Status: DC

## 2022-12-05 NOTE — Telephone Encounter (Signed)
Omeprazole resent to [facility].

## 2022-12-05 NOTE — Telephone Encounter (Signed)
Inbound call from patient requesting to resent prescription for Omeprazole.Marland KitchenMarland KitchenPatient stated pharmacy do not have it. Thanks

## 2022-12-06 ENCOUNTER — Telehealth: Payer: Self-pay

## 2022-12-06 NOTE — Telephone Encounter (Signed)
Pateint has been scheduled for a colonoscopy for hx of colon polyps. Colon booked for 03-05-2023. Pre-visit has been booked for 02-27-2023. Patient aware.

## 2022-12-06 NOTE — Telephone Encounter (Signed)
-----   Message from Meredith Pel, NP sent at 12/04/2022  8:00 PM EDT ----- Joni Reining,  Will you please get back to patient  and get her scheduled for a 5 year colonoscopy. Dr Myrtie Neither is comfortable proceeding ( this is the patient we saw in clinic who had a colon by Dr. Loreta Ave in 2019 and then another at [facility] in 2020). Can be done at [facility]. Thanks

## 2023-01-15 IMAGING — CT CT ABD-PELV W/ CM
2 of 5 series · 16 of 46 positions shown, 18 images · IV contrast (omnipaque)
Comparison: CT 10/06/2019

CLINICAL DATA: Right upper quadrant pain. Left upper quadrant pain
since [REDACTED].

EXAM:
CT ABDOMEN AND PELVIS WITH CONTRAST
TECHNIQUE: Multidetector CT imaging of the abdomen and pelvis was performed
using the standard protocol following bolus administration of
intravenous contrast.
CONTRAST:  100mL OMNIPAQUE IOHEXOL 300 MG/ML  SOLN

[Series 2: axial st · axial · 0.67mm/px · z∈[-483,-148]mm · 13 of 79 slices shown, 15 images]
[im 6/79  soft-tissue]
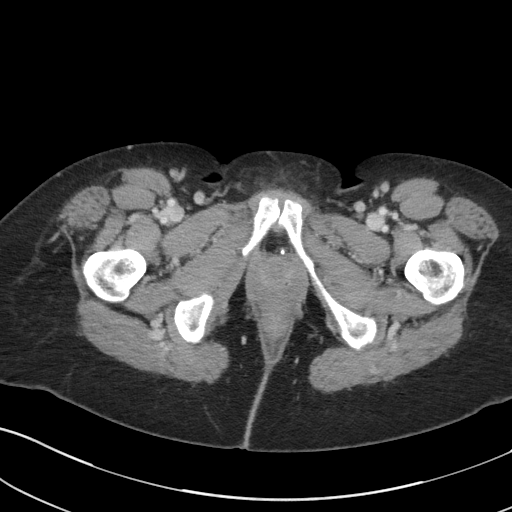
[im 6/79  bone]
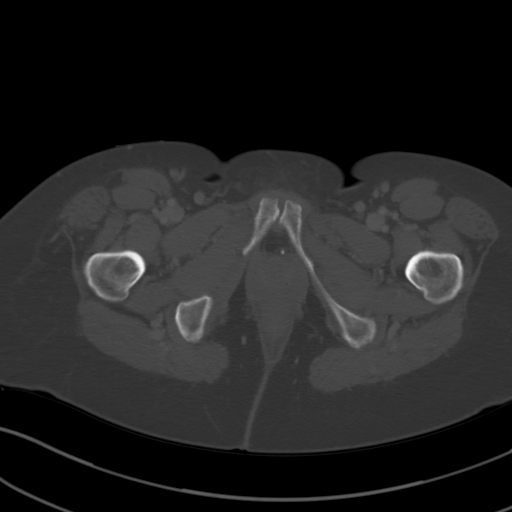
[im 12/79  soft-tissue]
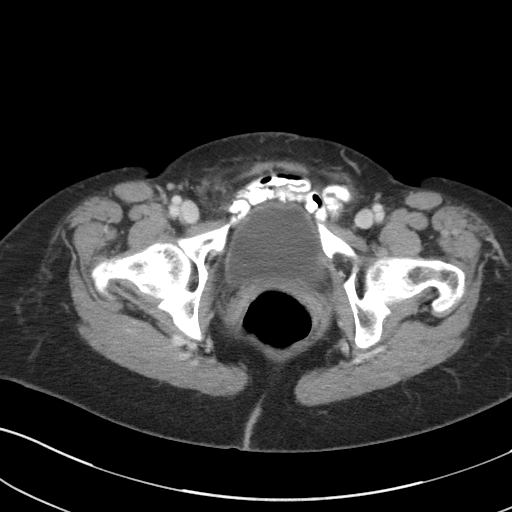
[im 17/79  soft-tissue]
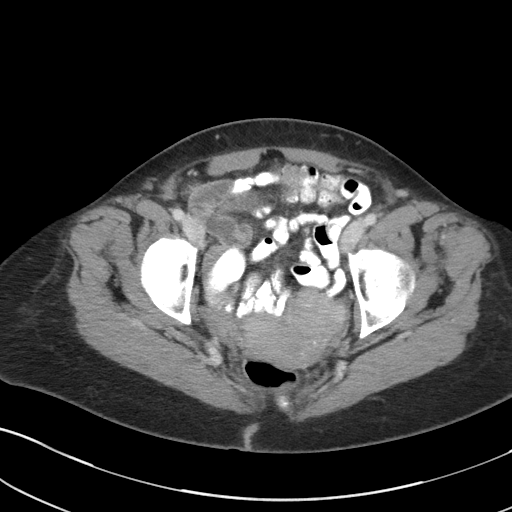
[im 23/79  soft-tissue]
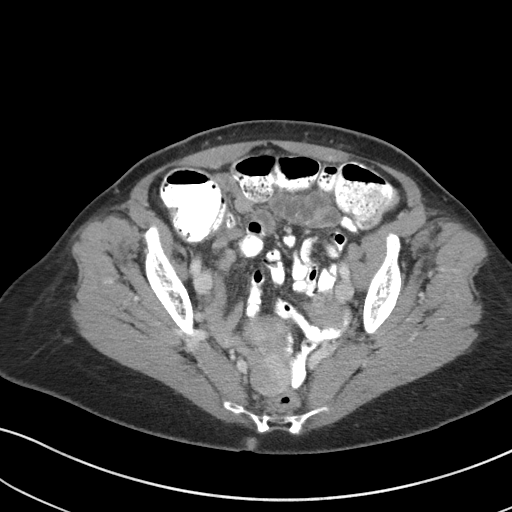
[im 28/79  soft-tissue]
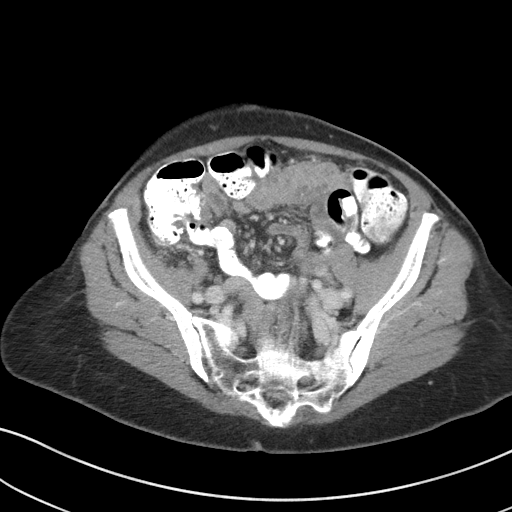
[im 34/79  soft-tissue]
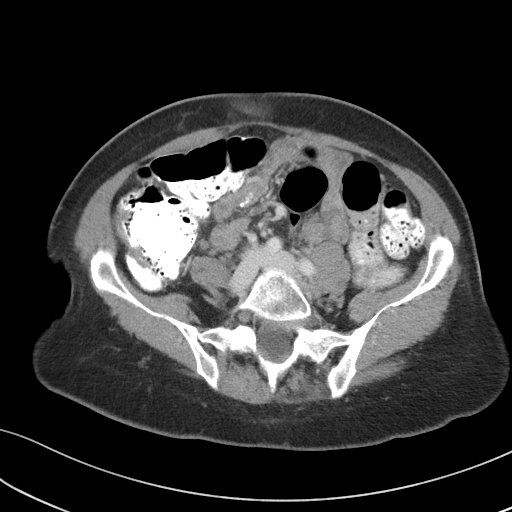
[im 40/79  soft-tissue]
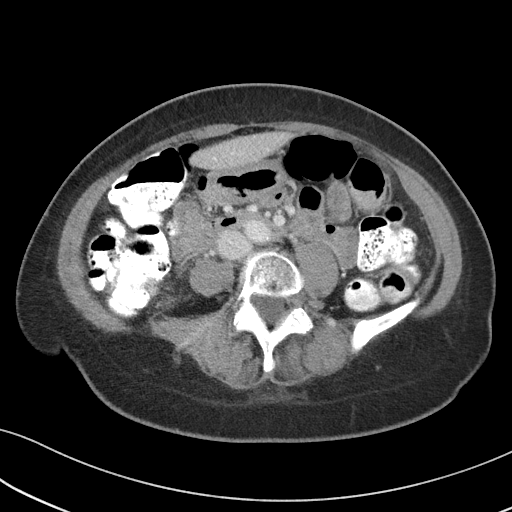
[im 45/79  soft-tissue]
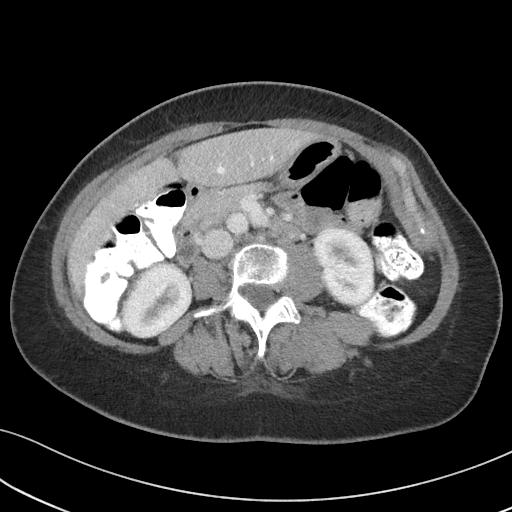
[im 51/79  soft-tissue]
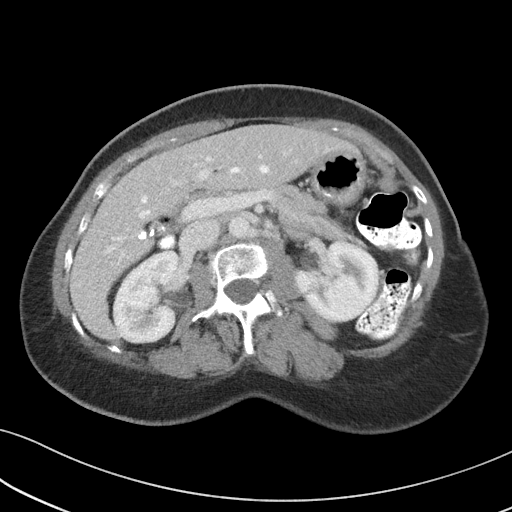
[im 51/79  bone]
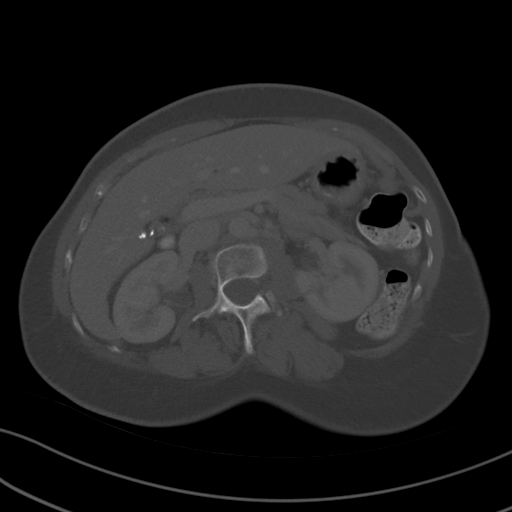
[im 56/79  soft-tissue]
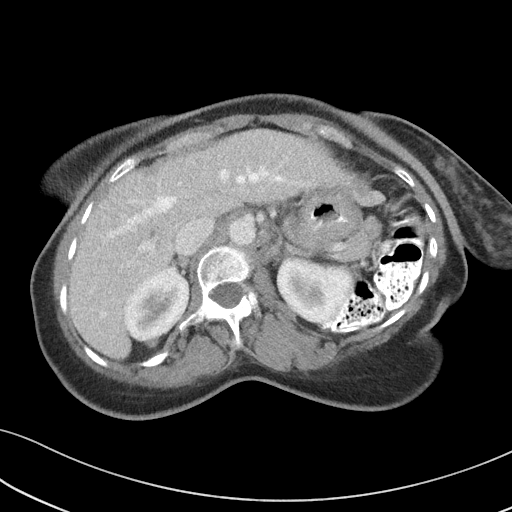
[im 62/79  soft-tissue]
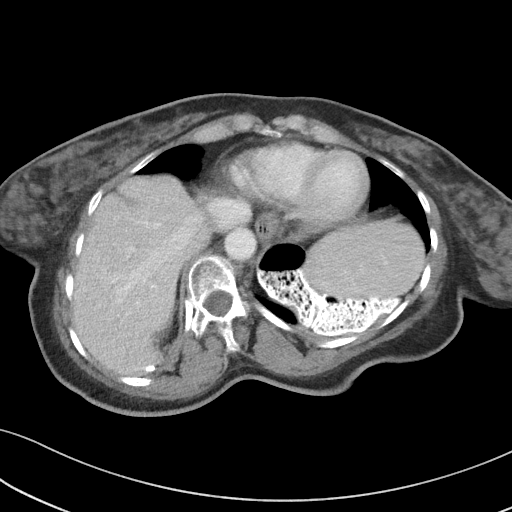
[im 67/79  soft-tissue]
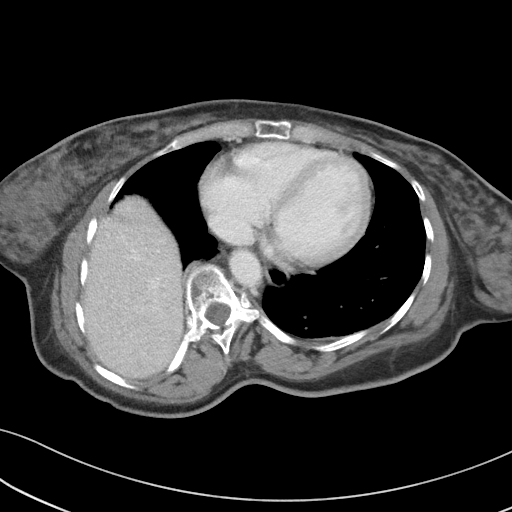
[im 73/79  soft-tissue]
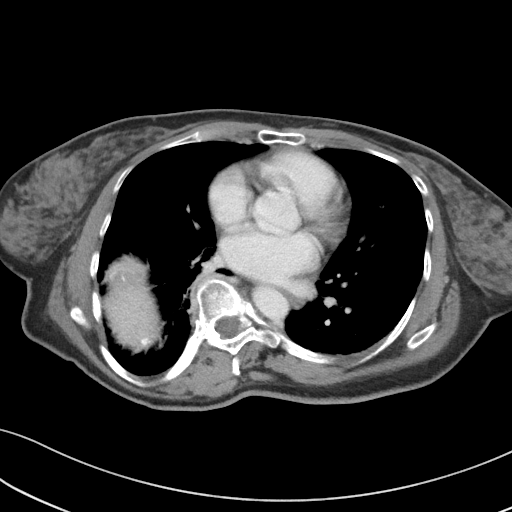

[Series 4: coronal st · coronal · 0.70mm/px · 3 of 86 slices shown]
[im 29/86  soft-tissue]
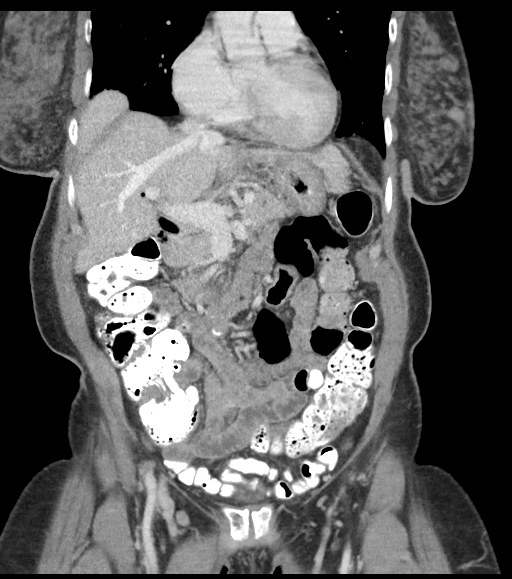
[im 38/86  soft-tissue]
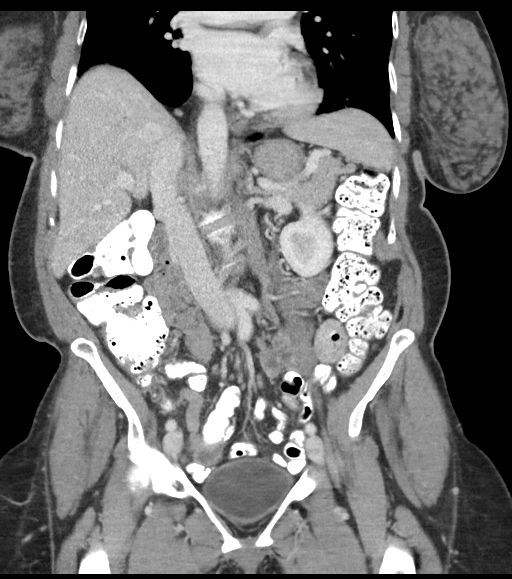
[im 48/86  soft-tissue]
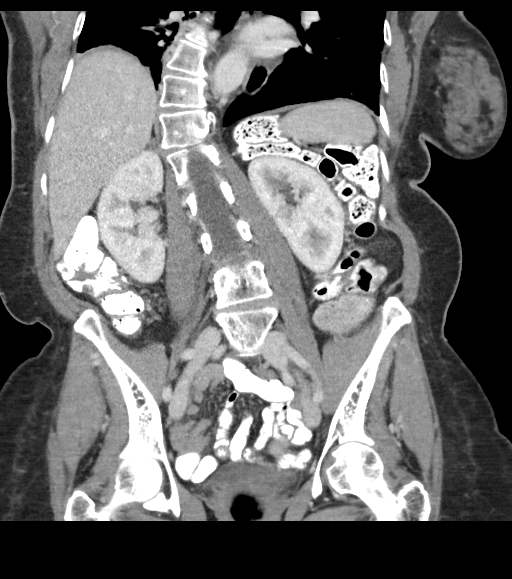

[16 of 46 positions shown; findings below may reference images not displayed]

FINDINGS: Lower chest: Chronic scarring or atelectasis in the lung bases.
Tortuous descending thoracic aorta. Normal heart size.

Hepatobiliary: No focal hepatic lesion. Post cholecystectomy with
similar pneumobilia to prior exam. No common bile duct or
intrahepatic ductal dilatation.

Pancreas: No ductal dilatation or inflammation.

Spleen: Normal in size without focal abnormality.

Adrenals/Urinary Tract: No adrenal nodule no hydronephrosis,
perinephric edema, or renal calculi. No focal renal abnormality
symmetric renal excretion on delayed phase imaging unremarkable
urinary bladder.

Stomach/Bowel: Decompressed stomach. There is no small bowel
obstruction or inflammation, administered contrast is seen in distal
small bowel and colon. The sigmoid and transverse colon are
redundant. Moderate stool in the ascending, transverse and
descending colon. There is no colonic wall thickening or
inflammation. The appendix is not confidently visualized, there is
no evidence of appendicitis.

Vascular/Lymphatic: Retroperitoneal vessel tortuosity. No aortic
aneurysm. Patent portal and splenic veins. No abdominopelvic
adenopathy.

Reproductive: Lobulated uterine fibroids, including exophytic
fibroids. The ovaries are not definitively visualized.

Other: No free air, free fluid, or intra-abdominal fluid collection.
No abdominal wall hernia.

Musculoskeletal: Moderate thoracolumbar scoliosis. There are no
acute or suspicious osseous abnormalities.
IMPRESSION: 1. No acute abnormality in the abdomen/pelvis.
2. Moderate colonic stool burden, can be seen with constipation. No
other explanation for abdominal pain.
3. Post cholecystectomy with pneumobilia, similar to prior exam. No
biliary dilatation.
4. Uterine fibroids.

## 2023-03-05 ENCOUNTER — Encounter: Payer: BLUE CROSS/BLUE SHIELD | Admitting: Gastroenterology

## 2023-04-03 ENCOUNTER — Encounter: Payer: BLUE CROSS/BLUE SHIELD | Admitting: Obstetrics and Gynecology

## 2023-05-03 ENCOUNTER — Other Ambulatory Visit (HOSPITAL_COMMUNITY): Payer: Self-pay

## 2024-01-05 ENCOUNTER — Other Ambulatory Visit: Payer: Self-pay | Admitting: Nurse Practitioner

## 2024-01-08 ENCOUNTER — Other Ambulatory Visit: Payer: Self-pay

## 2024-01-08 MED ORDER — OMEPRAZOLE 40 MG PO CPDR: 40.0000 mg | DELAYED_RELEASE_CAPSULE | Freq: Every day | ORAL | 0 refills | Status: AC
